# Patient Record
Sex: Female | Born: 1995 | Race: White | Hispanic: No | Marital: Single | Smoking: Never smoker
Health system: Southern US, Community
[De-identification: ages and names within clinical notes are randomized; demographics above are authoritative.]

## PROBLEM LIST (undated history)

## (undated) DIAGNOSIS — F419 Anxiety disorder, unspecified: Secondary | ICD-10-CM

## (undated) DIAGNOSIS — N83209 Unspecified ovarian cyst, unspecified side: Secondary | ICD-10-CM

## (undated) DIAGNOSIS — F909 Attention-deficit hyperactivity disorder, unspecified type: Secondary | ICD-10-CM

## (undated) DIAGNOSIS — Z8619 Personal history of other infectious and parasitic diseases: Secondary | ICD-10-CM

## (undated) DIAGNOSIS — B009 Herpesviral infection, unspecified: Secondary | ICD-10-CM

## (undated) DIAGNOSIS — G43909 Migraine, unspecified, not intractable, without status migrainosus: Secondary | ICD-10-CM

## (undated) DIAGNOSIS — N946 Dysmenorrhea, unspecified: Secondary | ICD-10-CM

## (undated) DIAGNOSIS — F32A Depression, unspecified: Secondary | ICD-10-CM

## (undated) DIAGNOSIS — J45909 Unspecified asthma, uncomplicated: Secondary | ICD-10-CM

## (undated) DIAGNOSIS — F329 Major depressive disorder, single episode, unspecified: Secondary | ICD-10-CM

## (undated) DIAGNOSIS — N809 Endometriosis, unspecified: Secondary | ICD-10-CM

## (undated) DIAGNOSIS — N83201 Unspecified ovarian cyst, right side: Secondary | ICD-10-CM

## (undated) HISTORY — DX: Endometriosis, unspecified: N80.9

## (undated) HISTORY — PX: APPENDECTOMY: SHX54

## (undated) HISTORY — DX: Migraine, unspecified, not intractable, without status migrainosus: G43.909

## (undated) HISTORY — DX: Dysmenorrhea, unspecified: N94.6

## (undated) HISTORY — DX: Unspecified ovarian cyst, right side: N83.201

## (undated) HISTORY — DX: Attention-deficit hyperactivity disorder, unspecified type: F90.9

## (undated) HISTORY — DX: Personal history of other infectious and parasitic diseases: Z86.19

## (undated) HISTORY — DX: Herpesviral infection, unspecified: B00.9

---
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
## 2014-04-21 DIAGNOSIS — N83201 Unspecified ovarian cyst, right side: Secondary | ICD-10-CM | POA: Insufficient documentation

## 2014-09-26 DIAGNOSIS — F900 Attention-deficit hyperactivity disorder, predominantly inattentive type: Secondary | ICD-10-CM | POA: Insufficient documentation

## 2015-09-01 ENCOUNTER — Ambulatory Visit
Admission: EM | Admit: 2015-09-01 | Discharge: 2015-09-01 | Disposition: A | Discharge status: Home / Self Care | Payer: 59 | Attending: Internal Medicine | Admitting: Internal Medicine

## 2015-09-01 ENCOUNTER — Ambulatory Visit (INDEPENDENT_AMBULATORY_CARE_PROVIDER_SITE_OTHER): Payer: 59

## 2015-09-01 DIAGNOSIS — M6740 Ganglion, unspecified site: Secondary | ICD-10-CM | POA: Diagnosis not present

## 2015-09-01 DIAGNOSIS — S63502A Unspecified sprain of left wrist, initial encounter: Secondary | ICD-10-CM

## 2015-09-01 HISTORY — DX: Anxiety disorder, unspecified: F41.9

## 2015-09-01 HISTORY — DX: Major depressive disorder, single episode, unspecified: F32.9

## 2015-09-01 HISTORY — DX: Unspecified ovarian cyst, unspecified side: N83.209

## 2015-09-01 HISTORY — DX: Depression, unspecified: F32.A

## 2015-09-01 MED ORDER — NAPROXEN 500 MG PO TABS: 500.0000 mg | ORAL_TABLET | Freq: Two times a day (BID) | ORAL | Status: DC

## 2015-09-01 NOTE — ED Notes (Signed)
In august initially injury doing 'burpees'.   Now feels worse.  Arrived with wrist in splint placed PTA.  Base of thumb pain is reported.  Also a "bump-like" protrusion reported to left wrist.

## 2015-09-01 NOTE — Discharge Instructions (Signed)
Ganglion Cyst  A ganglion cyst is a noncancerous, fluid-filled lump that occurs near joints or tendons. The ganglion cyst grows out of a joint or the lining of a tendon. It most often develops in the hand or wrist, but it can also develop in the shoulder, elbow, hip, knee, ankle, or foot. The round or oval ganglion cyst can be the size of a pea or larger than a grape. Increased activity may enlarge the size of the cyst because more fluid starts to build up.   CAUSES  It is not known what causes a ganglion cyst to grow. However, it may be related to:  · Inflammation or irritation around the joint.  · An injury.  · Repetitive movements or overuse.  · Arthritis.  RISK FACTORS  Risk factors include:  · Being a woman.  · Being age 20-50.  SIGNS AND SYMPTOMS  Symptoms may include:   · A lump. This most often appears on the hand or wrist, but it can occur in other areas of the body.  · Tingling.  · Pain.  · Numbness.  · Muscle weakness.  · Weak grip.  · Less movement in a joint.  DIAGNOSIS  Ganglion cysts are most often diagnosed based on a physical exam. Your health care provider will feel the lump and may shine a light alongside it. If it is a ganglion cyst, a light often shines through it. Your health care provider may order an X-ray, ultrasound, or MRI to rule out other conditions.  TREATMENT  Ganglion cysts usually go away on their own without treatment. If pain or other symptoms are involved, treatment may be needed. Treatment is also needed if the ganglion cyst limits your movement or if it gets infected. Treatment may include:  · Wearing a brace or splint on your wrist or finger.  · Taking anti-inflammatory medicine.  · Draining fluid from the lump with a needle (aspiration).  · Injecting a steroid into the joint.  · Surgery to remove the ganglion cyst.  HOME CARE INSTRUCTIONS  · Do not press on the ganglion cyst, poke it with a needle, or hit it.  · Take medicines only as directed by your health care  provider.  · Wear your brace or splint as directed by your health care provider.  · Watch your ganglion cyst for any changes.  · Keep all follow-up visits as directed by your health care provider. This is important.  SEEK MEDICAL CARE IF:  · Your ganglion cyst becomes larger or more painful.  · You have increased redness, red streaks, or swelling.  · You have pus coming from the lump.  · You have weakness or numbness in the affected area.  · You have a fever or chills.     This information is not intended to replace advice given to you by your health care provider. Make sure you discuss any questions you have with your health care provider.     Document Released: 10/11/2000 Document Revised: 11/04/2014 Document Reviewed: 03/29/2014  Elsevier Interactive Patient Education ©2016 [employer]

## 2015-09-01 NOTE — ED Notes (Addendum)
Can move wrist, although it does cause pain. PMS intact.   Brisk CAP refill.

## 2015-09-01 NOTE — ED Provider Notes (Signed)
CSN: [id number]     Arrival date & time 09/01/15  1701 History   First MD Initiated Contact with Patient 09/01/15 1836     Chief Complaint  Patient presents with  . Wrist Injury   (Consider location/radiation/quality/duration/timing/severity/associated sxs/prior Treatment) HPI   This a 19 year old female who presents with left radial wrist pain. She initially injured her wrist while doing kickboxing and fell on the floor while performing burpees. She states that it bothered her for a few weeks but seemed to improve. 2 days ago however she noticed a return of the pain and small bump at the base of her thumb which is very painful. 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 She's been using a Neopreme brace which at times does help. She also complains of some numbness in her thumb. She states that with flexion of her fingers it seems to make the pains worse as well.  Past Medical History  Diagnosis Date  . Depression   . Anxiety   . Ovarian cyst    Past Surgical History  Procedure Laterality Date  . Appendectomy     History reviewed. No pertinent family history. Social History  Substance Use Topics  . Smoking status: Never Smoker   . Smokeless tobacco: None  . Alcohol Use: 0.6 oz/week    1 Glasses of wine per week   OB History    No data available     Review of Systems  Constitutional: Positive for activity change. Negative for fever, chills and fatigue.  Musculoskeletal: Positive for myalgias.  Skin: Negative for color change, pallor, rash and wound.  All other systems reviewed and are negative.   Allergies  Wellbutrin  Home Medications   Prior to Admission medications   Medication Sig Start Date End Date Taking? Authorizing Provider  amphetamine-dextroamphetamine (ADDERALL) 20 MG tablet Take 20 mg by mouth as needed.   Yes Historical Provider, MD  busPIRone (BUSPAR) 7.5 MG tablet Take 7.5  mg by mouth as needed.   Yes Historical Provider, MD  naproxen (NAPROSYN) 500 MG tablet Take 1 tablet (500 mg total) by mouth 2 (two) times daily with a meal. 09/01/15   Lutricia FeilWilliam P Rebekah Zackery, PA-C   Meds Ordered and Administered this Visit  Medications - No data to display  BP 111/73 mmHg  Pulse 98  Temp(Src) 98.9 F (37.2 C) (Oral)  Resp 16  SpO2 100%  LMP  (LMP Unknown) No data found.   Physical Exam  Constitutional: She is oriented to person, place, and time. She appears well-developed and well-nourished. No distress.  HENT:  Head: Normocephalic and atraumatic.  Eyes: Pupils are equal, round, and reactive to light.  Neck: Neck supple.  Musculoskeletal: She exhibits tenderness. She exhibits no edema.  Examination of her left nondominant hand shows small firm nodule at the base of the thumb. There is no pulse appreciated with it. Examination is very limited due to patient cooperation complaining of severe pain out of proportion to findings. She complains of pain in all palpation of the thumb phalanges. There is no swelling ecchymosis or erythema present. Capillary refill is normal. Flexion of all fingers cause her to have discomfort.  Neurological: She is alert and oriented to person, place, and time.  Skin: Skin is warm and dry. She is not diaphoretic.  Psychiatric: She has a normal mood and affect. Her behavior is normal. Judgment and thought content normal.  Nursing  note and vitals reviewed.   ED Course  Procedures (including critical care time)  Labs Review Labs Reviewed - No data to display  Imaging Review Dg Wrist Complete Left  09/01/2015  CLINICAL DATA:  19 year old female with injury to the left wrist while kickboxing in August complaining of wrist pain. EXAM: LEFT WRIST - COMPLETE 3+ VIEW COMPARISON:  No priors. FINDINGS: There is no evidence of fracture or dislocation. There is no evidence of arthropathy or other focal bone abnormality. Soft tissues are unremarkable.  IMPRESSION: Negative. Electronically Signed   By: Trudie Reed M.D.   On: 09/01/2015 19:15     Visual Acuity Review  Right Eye Distance:   Left Eye Distance:   Bilateral Distance:    Right Eye Near:   Left Eye Near:    Bilateral Near:     19:30:39 Orders Placed WR  Thumb spica         MDM   1. Left wrist sprain, initial encounter   2. Volar retinacular ganglion    Discharge Medication List as of 09/01/2015  7:36 PM    START taking these medications   Details  naproxen (NAPROSYN) 500 MG tablet Take 1 tablet (500 mg total) by mouth 2 (two) times daily with a meal., Starting 09/01/2015, Until Discontinued, Print      Plan: 1. Test/x-ray results and diagnosis reviewed with patient 2. rx as per orders; risks, benefits, potential side effects reviewed with patient 3. Recommend supportive treatment with elevation /ice rest. Use splint at HS and work. Come out for quiet times.  4. F/u prn if symptoms worsen or don't improve. Consider hand surgeon for volar ganglion.                      Lutricia Feil, PA-C 09/01/15 [phone]

## 2015-11-05 ENCOUNTER — Emergency Department (HOSPITAL_COMMUNITY)
Admission: EM | Admit: 2015-11-05 | Discharge: 2015-11-05 | Disposition: A | Discharge status: Home / Self Care | Payer: 59 | Attending: Emergency Medicine | Admitting: Emergency Medicine

## 2015-11-05 ENCOUNTER — Encounter (HOSPITAL_COMMUNITY): Payer: Self-pay | Admitting: Emergency Medicine

## 2015-11-05 ENCOUNTER — Emergency Department (HOSPITAL_COMMUNITY): Payer: 59

## 2015-11-05 DIAGNOSIS — Z791 Long term (current) use of non-steroidal anti-inflammatories (NSAID): Secondary | ICD-10-CM | POA: Diagnosis not present

## 2015-11-05 DIAGNOSIS — M549 Dorsalgia, unspecified: Secondary | ICD-10-CM | POA: Diagnosis not present

## 2015-11-05 DIAGNOSIS — W000XXA Fall on same level due to ice and snow, initial encounter: Secondary | ICD-10-CM | POA: Insufficient documentation

## 2015-11-05 DIAGNOSIS — Y9289 Other specified places as the place of occurrence of the external cause: Secondary | ICD-10-CM | POA: Diagnosis not present

## 2015-11-05 DIAGNOSIS — M533 Sacrococcygeal disorders, not elsewhere classified: Secondary | ICD-10-CM | POA: Diagnosis not present

## 2015-11-05 DIAGNOSIS — Y998 Other external cause status: Secondary | ICD-10-CM | POA: Diagnosis not present

## 2015-11-05 DIAGNOSIS — S300XXA Contusion of lower back and pelvis, initial encounter: Secondary | ICD-10-CM | POA: Diagnosis not present

## 2015-11-05 DIAGNOSIS — Y9389 Activity, other specified: Secondary | ICD-10-CM | POA: Insufficient documentation

## 2015-11-05 DIAGNOSIS — Z8742 Personal history of other diseases of the female genital tract: Secondary | ICD-10-CM | POA: Insufficient documentation

## 2015-11-05 DIAGNOSIS — M545 Low back pain: Secondary | ICD-10-CM | POA: Diagnosis not present

## 2015-11-05 DIAGNOSIS — F419 Anxiety disorder, unspecified: Secondary | ICD-10-CM | POA: Diagnosis not present

## 2015-11-05 DIAGNOSIS — J45909 Unspecified asthma, uncomplicated: Secondary | ICD-10-CM | POA: Diagnosis not present

## 2015-11-05 DIAGNOSIS — S3992XA Unspecified injury of lower back, initial encounter: Secondary | ICD-10-CM | POA: Diagnosis present

## 2015-11-05 DIAGNOSIS — W19XXXA Unspecified fall, initial encounter: Secondary | ICD-10-CM

## 2015-11-05 HISTORY — DX: Unspecified asthma, uncomplicated: J45.909

## 2015-11-05 LAB — POC URINE PREG, ED: Preg Test, Ur: NEGATIVE

## 2015-11-05 MED ORDER — ONDANSETRON 4 MG PO TBDP
4.0000 mg | ORAL_TABLET | Freq: Once | ORAL | Status: AC
  Administered 2015-11-05: 4 mg via ORAL
  Filled 2015-11-05: qty 1

## 2015-11-05 MED ORDER — OXYCODONE-ACETAMINOPHEN 5-325 MG PO TABS
1.0000 | ORAL_TABLET | Freq: Once | ORAL | Status: AC
  Administered 2015-11-05: 1 via ORAL
  Filled 2015-11-05: qty 1

## 2015-11-05 MED ORDER — NAPROXEN 500 MG PO TABS: 500.0000 mg | ORAL_TABLET | Freq: Two times a day (BID) | ORAL | Status: DC

## 2015-11-05 NOTE — ED Notes (Signed)
Warm blankets given.

## 2015-11-05 NOTE — ED Notes (Signed)
Patient transported to X-ray 

## 2015-11-05 NOTE — Discharge Instructions (Signed)
Your x-rays today were normal-- no fracture seen. Take the prescribed medication as directed. You may continue to be sore for the next few days which is normal. Return to the ED for new or worsening symptoms.

## 2015-11-05 NOTE — ED Notes (Signed)
Pt. Felt better after eating crackers and gingerale

## 2015-11-05 NOTE — ED Provider Notes (Signed)
CSN: [id number]     Arrival date & time 11/05/15  1138 History   First MD Initiated Contact with Patient 11/05/15 1143     Chief Complaint  Patient presents with  . Fall     (Consider location/radiation/quality/duration/timing/severity/associated sxs/prior Treatment) Patient is a 20 y.o. female presenting with fall. The history is provided by the patient and medical records.  Fall Associated symptoms include arthralgias.     20 y.o. F with hx of depression, anxiety, asthma, ovarian cysts, presenting to the ED following a fall on ice this morning.  Patient states she was walking out to her car to go to work today when she slipped on some black ice and fell back onto her butt and low back.  No head injury or LOC.  Patient states she has severe pain at her "tailbone".  Denies numbness/weakness of her lower extremities.  No bowel or bladder incontinence.  Patient states she is ambulatory, but states it is painful. She has not had any medications prior to arrival.  Patient states she is concerned because she suffered T9-T11 fractures as a child from a fall from height.  States no significant issues with her back since this time.  Past Medical History  Diagnosis Date  . Depression   . Anxiety   . Ovarian cyst   . Asthma    Past Surgical History  Procedure Laterality Date  . Appendectomy     History reviewed. No pertinent family history. Social History  Substance Use Topics  . Smoking status: Never Smoker   . Smokeless tobacco: None  . Alcohol Use: 0.6 oz/week    1 Glasses of wine per week   OB History    No data available     Review of Systems  Musculoskeletal: Positive for back pain and arthralgias.  All other systems reviewed and are negative.     Allergies  Wellbutrin  Home Medications   Prior to Admission medications   Medication Sig Start Date End Date Taking? Authorizing Provider  amphetamine-dextroamphetamine (ADDERALL) 20 MG tablet Take 20 mg by mouth as needed.     Historical Provider, MD  busPIRone (BUSPAR) 7.5 MG tablet Take 7.5 mg by mouth as needed.    Historical Provider, MD  naproxen (NAPROSYN) 500 MG tablet Take 1 tablet (500 mg total) by mouth 2 (two) times daily with a meal. 09/01/15   Lutricia Feil, PA-C   BP 106/59 mmHg  Pulse 98  Temp(Src) 98.4 F (36.9 C) (Oral)  Resp 18  Ht 5' (1.524 m)  Wt 43.092 kg  BMI 18.55 kg/m2  SpO2 100%  LMP 10/05/2015   Physical Exam  Constitutional: She is oriented to person, place, and time. She appears well-developed and well-nourished. No distress.  HENT:  Head: Normocephalic and atraumatic.  Mouth/Throat: Oropharynx is clear and moist.  Eyes: Conjunctivae and EOM are normal. Pupils are equal, round, and reactive to light.  Neck: Normal range of motion. Neck supple.  Cardiovascular: Normal rate, regular rhythm and normal heart sounds.   Pulmonary/Chest: Effort normal and breath sounds normal. No respiratory distress. She has no wheezes.  Musculoskeletal: Normal range of motion.       Lumbar back: She exhibits tenderness, bony tenderness and pain.       Back:  TTP of lower lumbar spine and coccyx; no deformities noted; normal strength and sensation of BLE; gait slow but steady  Neurological: She is alert and oriented to person, place, and time.  Skin: Skin is warm and  dry. She is not diaphoretic.  Psychiatric: She has a normal mood and affect.  Nursing note and vitals reviewed.   ED Course  Procedures (including critical care time) Labs Review Labs Reviewed  POC URINE PREG, ED    Imaging Review Dg Lumbar Spine Complete  11/05/2015  CLINICAL DATA:  Coccyx and back pain status post fall today. EXAM: LUMBAR SPINE - COMPLETE 4+ VIEW COMPARISON:  None. FINDINGS: There is no evidence of lumbar spine fracture. Alignment is normal. Intervertebral disc spaces are maintained. IMPRESSION: Negative. Electronically Signed   By: Ted Mcalpineobrinka  Dimitrova M.D.   On: 11/05/2015 13:47   Dg  Sacrum/coccyx  11/05/2015  CLINICAL DATA:  Status post slip and fall today on ice with the buttock injury. Coccygeal pain. Initial encounter. EXAM: SACRUM AND COCCYX - 2+ VIEW COMPARISON:  None. FINDINGS: There is no evidence of fracture or other focal bone lesions. IMPRESSION: Negative exam. Electronically Signed   By: Drusilla Kannerhomas  Dalessio M.D.   On: 11/05/2015 13:47   I have personally reviewed and evaluated these images and lab results as part of my medical decision-making.   EKG Interpretation None      MDM   Final diagnoses:  Fall, initial encounter  Coccyx contusion, initial encounter   20 year old female here with back and buttock pain after a fall this morning. She landed on her coccyx. No head injury or loss of consciousness. On exam she has tenderness of her lower lumbar spine and coccyx. There is no acute bony deformity noted on exam. X-rays were obtained which are negative for acute findings. Patient remains neurologically intact. She's been ambulatory with slow but steady gait.  Will d/c home  Discussed plan with patient, he/she acknowledged understanding and agreed with plan of care.  Return precautions given for new or worsening symptoms.  At time of discharge once patient began moving she had episode of emesis.  She was given oral zofran.  Denies current abdominal pain.  VS remain stable.  Suspect this may have been from the pain medications. Patient was able to eat crackers and drink gingerale prior to discharge.    Garlon HatchetLisa M Chaitanya Amedee, PA-C 11/05/15 1515  Geoffery Lyonsouglas Delo, MD 11/05/15 [phone]

## 2015-11-05 NOTE — ED Notes (Signed)
Pt. Went to get up oob and developed nausea and vomited.  WE gave her a emesis bag and put her back into the bed.  Pt. Also given Zofran ODT. She is eating crackers and gingerale at the present time

## 2015-11-05 NOTE — ED Notes (Signed)
Was scraping car to go to work when she fell landing on her coccyx. Prone position on stretcher is position of comfort. Denies n/t distally.  Is concerned because she had t9, 10, 11 fx as younger child.

## 2015-11-15 DIAGNOSIS — H5213 Myopia, bilateral: Secondary | ICD-10-CM | POA: Diagnosis not present

## 2015-12-03 ENCOUNTER — Ambulatory Visit (INDEPENDENT_AMBULATORY_CARE_PROVIDER_SITE_OTHER): Payer: 59 | Admitting: Family Medicine

## 2015-12-03 VITALS — BP 108/84 | HR 87 | Temp 98.6°F | Resp 16 | Ht 63.0 in | Wt 102.0 lb

## 2015-12-03 DIAGNOSIS — N3001 Acute cystitis with hematuria: Secondary | ICD-10-CM | POA: Diagnosis not present

## 2015-12-03 DIAGNOSIS — R300 Dysuria: Secondary | ICD-10-CM | POA: Diagnosis not present

## 2015-12-03 DIAGNOSIS — N3000 Acute cystitis without hematuria: Secondary | ICD-10-CM | POA: Diagnosis not present

## 2015-12-03 LAB — POCT URINALYSIS DIP (MANUAL ENTRY)
BILIRUBIN UA: NEGATIVE
Bilirubin, UA: NEGATIVE
Glucose, UA: 100 — AB
Nitrite, UA: POSITIVE — AB
PROTEIN UA: NEGATIVE
UROBILINOGEN UA: 0.2
pH, UA: 5.5

## 2015-12-03 LAB — POC MICROSCOPIC URINALYSIS (UMFC): Mucus: ABSENT

## 2015-12-03 MED ORDER — NITROFURANTOIN MONOHYD MACRO 100 MG PO CAPS: 100.0000 mg | ORAL_CAPSULE | Freq: Two times a day (BID) | ORAL | Status: DC

## 2015-12-03 NOTE — Patient Instructions (Signed)

## 2015-12-03 NOTE — Progress Notes (Signed)
Subjective:    Patient ID: Hayley Jensen, female    DOB: 1996-02-01, 20 y.o.   MRN: [id number] By signing my name below, I, Javier Docker, attest that this documentation has been prepared under the direction and in the presence of Norberto Sorenson, MD. Electronically Signed: Javier Docker, ER Scribe. 12/03/2015. 11:23 AM.  Chief Complaint  Patient presents with  . Dysuria    x 3 days, hx of UTIs    HPI HPI Comments: Hayley Jensen is a 20 y.o. female with a past hx of UTIs who presents to [facility] complaining of dysuria and urinary urgency for the last three days. She also noted kidney pain and back spasms one week ago. She denies current flank pain. She denies hematuria or vaginal discharge. She was last treated for a UTI in June, 2016. She is taking OTC AZO. She is taking kaitlin birth control pills. Her bowel movements have been normal.   Past Medical History  Diagnosis Date  . Depression   . Anxiety   . Ovarian cyst   . Asthma    Allergies  Allergen Reactions  . Wellbutrin [Bupropion] Nausea And Vomiting   Current Outpatient Prescriptions on File Prior to Visit  Medication Sig Dispense Refill  . albuterol (PROVENTIL HFA;VENTOLIN HFA) 108 (90 Base) MCG/ACT inhaler Inhale 1 puff into the lungs every 6 (six) hours as needed for wheezing or shortness of breath.    . amphetamine-dextroamphetamine (ADDERALL) 20 MG tablet Take 20 mg by mouth daily as needed.     . busPIRone (BUSPAR) 7.5 MG tablet Take 7.5 mg by mouth daily as needed. For anxiety    . naproxen (NAPROSYN) 500 MG tablet Take 1 tablet (500 mg total) by mouth 2 (two) times daily with a meal. 30 tablet 0  . Norethindrone-Ethinyl Estradiol-Fe (KAITLIB FE) 0.8-25 MG-MCG tablet Chew 1 tablet by mouth daily.     No current facility-administered medications on file prior to visit.    Review of Systems  Constitutional: Negative for fever and chills.  Gastrointestinal: Negative for nausea, abdominal pain, diarrhea and  constipation.  Genitourinary: Positive for dysuria, urgency and frequency.      Objective:  BP 108/84 mmHg  Pulse 87  Temp(Src) 98.6 F (37 C)  Resp 16  Ht  (1.6 m)  Wt 102 lb (46.267 kg)  BMI 18.07 kg/m2  SpO2 98%  LMP 10/05/2015  Physical Exam  Constitutional: She is oriented to person, place, and time. She appears well-developed and well-nourished. No distress.  HENT:  Head: Normocephalic and atraumatic.  Mouth/Throat: Oropharynx is clear and moist. No oropharyngeal exudate.  Eyes: Pupils are equal, round, and reactive to light.  Neck: Neck supple.  Cardiovascular: Normal rate, regular rhythm and normal heart sounds.   No murmur heard. Pulmonary/Chest: Effort normal. No respiratory distress.  Abdominal: Soft. She exhibits no distension. There is no tenderness.  No significant CVA tenderness.  Musculoskeletal: Normal range of motion.  Neurological: She is alert and oriented to person, place, and time. Coordination normal.  Skin: Skin is warm and dry. She is not diaphoretic.  Psychiatric: She has a normal mood and affect. Her behavior is normal.  Nursing note and vitals reviewed.   Results for orders placed or performed in visit on 12/03/15  POCT Microscopic Urinalysis ([facility])  Result Value Ref Range   WBC,UR,HPF,POC Moderate (A) None WBC/hpf   RBC,UR,HPF,POC Few (A) None RBC/hpf   Bacteria Few (A) None, Too numerous to count   Mucus  Absent Absent   Epithelial Cells, UR Per Microscopy Few (A) None, Too numerous to count cells/hpf  POCT urinalysis dipstick  Result Value Ref Range   Color, UA orange (A) yellow   Clarity, UA clear clear   Glucose, UA =100 (A) negative   Bilirubin, UA negative negative   Ketones, POC UA negative negative   Spec Grav, UA <=1.005    Blood, UA small (A) negative   pH, UA 5.5    Protein Ur, POC negative negative   Urobilinogen, UA 0.2    Nitrite, UA Positive (A) Negative   Leukocytes, UA small (1+) (A) Negative      Assessment &  Plan:   1. Dysuria   2. Acute cystitis with hematuria     Orders Placed This Encounter  Procedures  . Urine culture  . POCT Microscopic Urinalysis ([facility])  . POCT urinalysis dipstick    Meds ordered this encounter  Medications  . nitrofurantoin, macrocrystal-monohydrate, (MACROBID) 100 MG capsule    Sig: Take 1 capsule (100 mg total) by mouth 2 (two) times daily.    Dispense:  14 capsule    Refill:  0    I personally performed the services described in this documentation, which was scribed in my presence. The recorded information has been reviewed and considered, and addended by me as needed.  Norberto Sorenson, MD MPH

## 2015-12-05 LAB — URINE CULTURE

## 2015-12-22 ENCOUNTER — Encounter: Payer: Self-pay | Admitting: Family Medicine

## 2016-01-06 DIAGNOSIS — N898 Other specified noninflammatory disorders of vagina: Secondary | ICD-10-CM | POA: Diagnosis not present

## 2016-01-06 DIAGNOSIS — N760 Acute vaginitis: Secondary | ICD-10-CM | POA: Diagnosis not present

## 2016-02-29 ENCOUNTER — Ambulatory Visit (INDEPENDENT_AMBULATORY_CARE_PROVIDER_SITE_OTHER): Payer: 59 | Admitting: Physician Assistant

## 2016-02-29 VITALS — BP 112/70 | HR 99 | Temp 99.0°F | Resp 16 | Ht 63.0 in | Wt 103.0 lb

## 2016-02-29 DIAGNOSIS — R238 Other skin changes: Secondary | ICD-10-CM

## 2016-02-29 DIAGNOSIS — R21 Rash and other nonspecific skin eruption: Secondary | ICD-10-CM

## 2016-02-29 MED ORDER — VALACYCLOVIR HCL 500 MG PO TABS: 500.0000 mg | ORAL_TABLET | Freq: Two times a day (BID) | ORAL | Status: DC

## 2016-02-29 NOTE — Patient Instructions (Signed)
     IF you received an x-ray today, you will receive an invoice from [facility]. Please contact [facility] at [phone] with questions or concerns regarding your invoice.   IF you received labwork today, you will receive an invoice from [facility]. Please contact [facility] at [phone] with questions or concerns regarding your invoice.   Our billing staff will not be able to assist you with questions regarding bills from these companies.  You will be contacted with the lab results as soon as they are available. The fastest way to get your results is to activate your My Chart account. Instructions are located on the last page of this paperwork. If you have not heard from us regarding the results in 2 weeks, please contact this office.      

## 2016-02-29 NOTE — Progress Notes (Signed)
02/29/2016 2:57 PM   DOB: Apr 07, 1996 / MRN: [id number]  SUBJECTIVE:  Hayley Jensen is a 20 y.o. female presenting for a vesicular rash just lateral to the left labia minora.  Reports she was at the beach last week and go too much sun and this problem started 3 days later.  She reports a positive HSV1 culture of the same area about four months ago at [facility].  She reports the rash is mildly painful today, and the pain is much less than the initial outbreak four months ago.  She would like to have some medication on hand for future outbreaks.  She reports no new partners at this time.   She is allergic to wellbutrin.   She  has a past medical history of Depression; Anxiety; Ovarian cyst; and Asthma.    She  reports that she has never smoked. She does not have any smokeless tobacco history on file. She reports that she drinks about 0.6 oz of alcohol per week. She reports that she does not use illicit drugs. She  reports that she currently engages in sexual activity. She reports using the following method of birth control/protection: Pill. The patient  has past surgical history that includes Appendectomy.  Her family history is not on file.  Review of Systems  Constitutional: Negative for fever and chills.  Eyes: Negative for blurred vision.  Respiratory: Negative for cough and shortness of breath.   Cardiovascular: Negative for chest pain.  Gastrointestinal: Negative for nausea and abdominal pain.  Genitourinary: Negative for dysuria, urgency and frequency.  Musculoskeletal: Negative for myalgias.  Skin: Negative for rash.  Neurological: Positive for tingling. Negative for dizziness and headaches.  Psychiatric/Behavioral: Negative for depression. The patient is not nervous/anxious.     Problem list and medications reviewed and updated by myself where necessary, and exist elsewhere in the encounter.   OBJECTIVE:  BP 112/70 mmHg  Pulse 99  Temp(Src) 99 F (37.2 C)  Resp 16  Ht  5\' 3"  (1.6 m)  Wt 103 lb (46.72 kg)  BMI 18.25 kg/m2  SpO2 98%  Physical Exam  Constitutional: She is oriented to person, place, and time. She appears well-developed.  Cardiovascular: Normal rate and regular rhythm.   Pulmonary/Chest: Effort normal and breath sounds normal.  Genitourinary:    No tenderness or bleeding in the vagina. No vaginal discharge found.  Neurological: She is alert and oriented to person, place, and time.  Skin: She is not diaphoretic.    No results found for this or any previous visit (from the past 72 hour(s)).  No results found.  ASSESSMENT AND PLAN  Hayley LedererKylyn was seen today for other.  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  Will provide her with treatment today, and enough medication to treat future outbreaks should they occur.  RTC if her symptoms change or the rash does not improve with therapy.  -     valACYclovir (VALTREX) 500 MG tablet; Take 1 tablet (500 mg total) by mouth 2 (two) times daily. Take for three days.  Repeat course at first sign of a new outbreak.    The patient was advised to call or return to clinic if she does not see an improvement in symptoms or to seek the care of the closest emergency department if she worsens with the above plan.   Deliah BostonMichael Clark, MHS, PA-C [facility]  02/29/2016 2:57 PM

## 2016-05-18 ENCOUNTER — Ambulatory Visit (INDEPENDENT_AMBULATORY_CARE_PROVIDER_SITE_OTHER): Payer: 59 | Admitting: Family Medicine

## 2016-05-18 VITALS — BP 124/80 | HR 63 | Temp 98.3°F | Resp 16 | Ht 63.0 in | Wt 99.6 lb

## 2016-05-18 DIAGNOSIS — R21 Rash and other nonspecific skin eruption: Secondary | ICD-10-CM

## 2016-05-18 DIAGNOSIS — L42 Pityriasis rosea: Secondary | ICD-10-CM

## 2016-05-18 DIAGNOSIS — N946 Dysmenorrhea, unspecified: Secondary | ICD-10-CM | POA: Insufficient documentation

## 2016-05-18 LAB — POCT SKIN KOH: SKIN KOH, POC: NEGATIVE

## 2016-05-18 MED ORDER — TRIAMCINOLONE ACETONIDE 0.1 % EX CREA: 1.0000 "application " | TOPICAL_CREAM | Freq: Two times a day (BID) | CUTANEOUS | Status: DC

## 2016-05-18 NOTE — Progress Notes (Signed)
By signing my name below, I, Hayley Jensen, attest that this documentation has been prepared under the direction and in the presence of Hayley Staggers, MD.  Electronically Signed: Arvilla Jensen, Medical Scribe. 05/18/2016. 11:10 AM.  Subjective:    Patient ID: Hayley Jensen, female    DOB: 06/26/1996, 20 y.o.   MRN: [id number]  HPI Chief Complaint  Patient presents with  . Rash    1 1/2 week - itches and spreading - has a patch of rash on Rt leg x 1 month    HPI Comments: Hayley Jensen is a 20 y.o. female who presents to the [facility] complaining of rash that started on the back of her thigh onset a month. Pt mentions her rash has now spread to her abdomen, and back. Pt reports itchiness from the rash. Pt states if it gets brushes up on then it becomes itchy. Pt has dry mouth when she wakes up and states it's even dryer today. Pt states this is irregular for her, even in the summer. Pt has tried steroid, and anti-fungal cream BID for a day and a half- this caused her rash to spread. Pt states it's not contagious because her boyfriend hasn't got it. Pt states she went through a high stress experience about a month ago and the rash appeared shortly after. Pt states she has had shingles. Pt states she's currently on her menses. Pt has endosalpingiosis, and takes hydrocodone PRN for relief.  Pt denies having a cold before the rash occurred. Pt denies using any new creams, lotions, or body sprays. Pt denies fevers, chill, or weight loss.  Pt takes Adderall occasionally PRN. FHx: of psoriasis   Patient Active Problem List   Diagnosis Date Noted  . Dysmenorrhea 05/18/2016  . 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 Cyst of right ovary 04/21/2014   Past Medical History  Diagnosis Date  . Depression   . Anxiety   . Ovarian cyst   . Asthma    Past Surgical History  Procedure Laterality Date  . Appendectomy     Allergies    Allergen Reactions  . Wellbutrin [Bupropion] Nausea And Vomiting   Prior to Admission medications   Medication Sig Start Date End Date Taking? Authorizing Provider  albuterol (PROVENTIL HFA;VENTOLIN HFA) 108 (90 Base) MCG/ACT inhaler Inhale 1 puff into the lungs every 6 (six) hours as needed for wheezing or shortness of breath.   Yes Historical Provider, MD  amphetamine-dextroamphetamine (ADDERALL) 20 MG tablet Take 20 mg by mouth daily as needed.    Yes Historical Provider, MD  busPIRone (BUSPAR) 7.5 MG tablet Take 7.5 mg by mouth daily as needed. For anxiety   Yes Historical Provider, MD  naproxen (NAPROSYN) 500 MG tablet Take 1 tablet (500 mg total) by mouth 2 (two) times daily with a meal. 11/05/15  Yes Garlon Hatchet, PA-C  Norethindrone-Ethinyl Estradiol-Fe (KAITLIB FE) 0.8-25 MG-MCG tablet Chew 1 tablet by mouth daily.   Yes Historical Provider, MD  valACYclovir (VALTREX) 500 MG tablet Take 1 tablet (500 mg total) by mouth 2 (two) times daily. Take for three days.  Repeat course at first sign of a new outbreak. 02/29/16  Yes Ofilia Neas, PA-C   Social History   Social History  . Marital Status: Single    Spouse Name: Hayley Jensen  . Number of Children: Hayley Jensen  . Years of Education: Hayley Jensen   Occupational History  . Not on file.   Social  History Main Topics  . Smoking status: Never Smoker   . Smokeless tobacco: Not on file  . Alcohol Use: 0.6 oz/week    1 Glasses of wine per week  . Drug Use: No  . Sexual Activity: Yes    Birth Control/ Protection: Pill   Other Topics Concern  . Not on file   Social History Narrative   Review of Systems  Constitutional: Negative for fever, chills and unexpected weight change.  Skin: Positive for rash.    Objective:  BP 124/80 mmHg  Pulse 63  Temp(Src) 98.3 F (36.8 C) (Oral)  Resp 16  Ht 5\' 3"  (1.6 m)  Wt 99 lb 9.6 oz (45.178 kg)  BMI 17.65 kg/m2  SpO2 100%  LMP 05/17/2016  Physical Exam  Constitutional: She appears well-developed and  well-nourished. No distress.  HENT:  Head: Normocephalic and atraumatic.  Eyes: Conjunctivae are normal.  Neck: Neck supple.  Cardiovascular: Normal rate.   Pulmonary/Chest: Effort normal.  Neurological: She is alert.  Skin: Skin is warm and dry.  Patch on the right posterior thigh- measures 3cm across Slight dry appearance Faint erythema with a dry scale Possible central clearing, there are other scattered patches on her abdomen with slight erythema vs salmon color pigmentation with faint scale on larger areas Few scattered patches on the upper chest and a few scattered lesions on the back  Psychiatric: She has a normal mood and affect. Her behavior is normal.  Nursing note and vitals reviewed.  Results for orders placed or performed in visit on 05/18/16  POCT Skin KOH  Result Value Ref Range   Skin KOH, POC Negative   Scraping was performed on the thigh lesion.  Assessment & Plan:   Tavionna Grout Gawthrop is a 20 y.o. female Rash and nonspecific skin eruption - Plan: POCT Skin KOH, triamcinolone cream (KENALOG) 0.1 %  Pityriasis rosea  Suspected pityriasis rosea with herald lesion/patch on her right thigh. Does not appear to have rash in sun exposed areas, less likely tinea versicolor. Less likely drug reaction.  -Symptomatic care discussed, Kenalog topical if needed for itching areas, and over-the-counter antihistamine if needed. RTC precautions and handout given on after visit summary.  Meds ordered this encounter  Medications  . albuterol (PROVENTIL HFA;VENTOLIN HFA) 108 (90 Base) MCG/ACT inhaler    Sig: Inhale into the lungs.  Marland Kitchen HYDROcodone-acetaminophen (NORCO/VICODIN) 5-325 MG tablet    Sig: Take by mouth.  . nystatin cream (MYCOSTATIN)    Sig: Apply topically.  Marland Kitchen amphetamine-dextroamphetamine (ADDERALL) 20 MG tablet    Sig: Take by mouth.  . Norethindrone-Ethinyl Estradiol-Fe (KAITLIB FE) 0.8-25 MG-MCG tablet    Sig:   . naproxen (NAPROSYN) 500 MG tablet    Sig: Take by  mouth.  . triamcinolone cream (KENALOG) 0.1 %    Sig: Apply 1 application topically 2 (two) times daily.    Dispense:  30 g    Refill:  0   Patient Instructions     Your rash appears to be due to pure Isis rosea. See information on this condition below. Sometimes this can cause some itching, so okay to use the steroid cream I prescribed 2-3 times per day at the most. Continue over-the-counter allergy medicine such as Allegra, Claritin, or Zyrtec. If any spread of rash to genitals or mouth lesions, return right away as this would not be due to that condition. Let me know if you have questions in the meantime.    Pityriasis Rosea Pityriasis rosea is a  rash that usually appears on the trunk of the body. It may also appear on the upper arms and upper legs. It usually begins as a single patch, and then more patches begin to develop. The rash may cause mild itching, but it normally does not cause other problems. It usually goes away without treatment. However, it may take weeks or months for the rash to go away completely. CAUSES The cause of this condition is not known. The condition does not spread from person to person (is noncontagious). RISK FACTORS This condition is more likely to develop in young adults and children. It is most common in the spring and fall. SYMPTOMS The main symptom of this condition is a rash.  The rash usually begins with a single oval patch that is larger than the ones that follow. This is called a herald patch. It generally appears a week or more before the rest of the rash appears.  When more patches start to develop, they spread quickly on the trunk, back, and arms. These patches are smaller than the first one.  The patches that make up the rash are usually oval-shaped and pink or red in color. They are usually flat, but they may sometimes be raised so that they can be felt with a finger. They may also be finely crinkled and have a scaly ring around the edge.  The  rash does not typically appear on areas of the skin that are exposed to the sun. Most people who have this condition do not have other symptoms, but some have mild itching. In a few cases, a mild headache or body aches may occur before the rash appears and then go away. DIAGNOSIS Your health care provider may diagnose this condition by doing a physical exam and taking your medical history. To rule out other possible causes for the rash, the health care provider may order blood tests or take a skin sample from the rash to be looked at under a microscope. TREATMENT Usually, treatment is not needed for this condition. The rash will probably go away on its own in 4-8 weeks. In some cases, a health care provider may recommend or prescribe medicine to reduce itching. HOME CARE INSTRUCTIONS  Take medicines only as directed by your health care provider.  Avoid scratching the affected areas of skin.  Do not take hot baths or use a sauna. Use only warm water when bathing or showering. Heat can increase itching. SEEK MEDICAL CARE IF:  Your rash does not go away in 8 weeks.  Your rash gets much worse.  You have a fever.  You have swelling or pain in the rash area.  You have fluid, blood, or pus coming from the rash area.   This information is not intended to replace advice given to you by your health care provider. Make sure you discuss any questions you have with your health care provider.   Document Released: 11/20/2001 Document Revised: 02/28/2015 Document Reviewed: 09/21/2014 Elsevier Interactive Patient Education 2016 [employer].   IF you received an x-ray today, you will receive an invoice from [facility]. Please contact [facility] at [phone] with questions or concerns regarding your invoice.   IF you received labwork today, you will receive an invoice from [employer]. Please contact [facility] at [phone] with questions or concerns  regarding your invoice.   Our billing staff will not be able to assist you with questions regarding bills from these companies.  You will be contacted with  the lab results as soon as they are available. The fastest way to get your results is to activate your My Chart account. Instructions are located on the last page of this paperwork. If you have not heard from [place] regarding the results in 2 weeks, please contact this office.          I personally performed the services described in this documentation, which was scribed in my presence. The recorded information has been reviewed and considered, and addended by me as needed.   Signed,   Hayley Staggers, MD [facility].  05/18/2016 11:53 AM

## 2016-05-18 NOTE — Patient Instructions (Addendum)
Your rash appears to be due to Pityriasis Rosea. See information on this condition below. Sometimes this can cause some itching, so okay to use the steroid cream I prescribed (2-3 times per day at the most). Continue over-the-counter allergy medicine such as Allegra, Claritin, or Zyrtec. If any spread of rash to genitals or mouth lesions, return right away as this would not be due to that condition. Let me know if you have questions in the meantime.   Pityriasis Rosea Pityriasis rosea is a rash that usually appears on the trunk of the body. It may also appear on the upper arms and upper legs. It usually begins as a single patch, and then more patches begin to develop. The rash may cause mild itching, but it normally does not cause other problems. It usually goes away without treatment. However, it may take weeks or months for the rash to go away completely. CAUSES The cause of this condition is not known. The condition does not spread from person to person (is noncontagious). RISK FACTORS This condition is more likely to develop in young adults and children. It is most common in the spring and fall. SYMPTOMS The main symptom of this condition is a rash.  The rash usually begins with a single oval patch that is larger than the ones that follow. This is called a herald patch. It generally appears a week or more before the rest of the rash appears.  When more patches start to develop, they spread quickly on the trunk, back, and arms. These patches are smaller than the first one.  The patches that make up the rash are usually oval-shaped and pink or red in color. They are usually flat, but they may sometimes be raised so that they can be felt with a finger. They may also be finely crinkled and have a scaly ring around the edge.  The rash does not typically appear on areas of the skin that are exposed to the sun. Most people who have this condition do not have other symptoms, but some have mild  itching. In a few cases, a mild headache or body aches may occur before the rash appears and then go away. DIAGNOSIS Your health care provider may diagnose this condition by doing a physical exam and taking your medical history. To rule out other possible causes for the rash, the health care provider may order blood tests or take a skin sample from the rash to be looked at under a microscope. TREATMENT Usually, treatment is not needed for this condition. The rash will probably go away on its own in 4-8 weeks. In some cases, a health care provider may recommend or prescribe medicine to reduce itching. HOME CARE INSTRUCTIONS  Take medicines only as directed by your health care provider.  Avoid scratching the affected areas of skin.  Do not take hot baths or use a sauna. Use only warm water when bathing or showering. Heat can increase itching. SEEK MEDICAL CARE IF:  Your rash does not go away in 8 weeks.  Your rash gets much worse.  You have a fever.  You have swelling or pain in the rash area.  You have fluid, blood, or pus coming from the rash area.   This information is not intended to replace advice given to you by your health care provider. Make sure you discuss any questions you have with your health care provider.   Document Released: 11/20/2001 Document Revised: 02/28/2015 Document Reviewed: 09/21/2014 Elsevier Interactive Patient Education  2016 Elsevier Inc.   IF you received an x-ray today, you will receive an invoice from [facility]. Please contact [facility] at [phone] with questions or concerns regarding your invoice.   IF you received labwork today, you will receive an invoice from [employer]. Please contact [facility] at [phone] with questions or concerns regarding your invoice.   Our billing staff will not be able to assist you with questions regarding bills from these companies.  You will be contacted with the  lab results as soon as they are available. The fastest way to get your results is to activate your My Chart account. Instructions are located on the last page of this paperwork. If you have not heard from [place] regarding the results in 2 weeks, please contact this office.

## 2016-09-06 ENCOUNTER — Ambulatory Visit (INDEPENDENT_AMBULATORY_CARE_PROVIDER_SITE_OTHER): Payer: 59 | Admitting: Family Medicine

## 2016-09-06 VITALS — BP 102/66 | HR 96 | Temp 99.1°F | Resp 18 | Ht 63.0 in | Wt 100.0 lb

## 2016-09-06 DIAGNOSIS — R112 Nausea with vomiting, unspecified: Secondary | ICD-10-CM

## 2016-09-06 DIAGNOSIS — R059 Cough, unspecified: Secondary | ICD-10-CM

## 2016-09-06 DIAGNOSIS — R05 Cough: Secondary | ICD-10-CM | POA: Diagnosis not present

## 2016-09-06 MED ORDER — BENZONATATE 100 MG PO CAPS: 100.0000 mg | ORAL_CAPSULE | Freq: Three times a day (TID) | ORAL | 0 refills | Status: DC | PRN

## 2016-09-06 MED ORDER — ONDANSETRON 4 MG PO TBDP: 4.0000 mg | ORAL_TABLET | Freq: Three times a day (TID) | ORAL | 0 refills | Status: DC | PRN

## 2016-09-06 NOTE — Patient Instructions (Addendum)
Zofran 4 mg every 8 hours as needed for nausea.  Benzonatate 100 mg up to 3 times daily as needed for cough.   IF you received an x-ray today, you will receive an invoice from [facility]. Please contact [facility] at [phone] with questions or concerns regarding your invoice.   IF you received labwork today, you will receive an invoice from [facility]. Please contact [facility] at [phone] with questions or concerns regarding your invoice.   Our billing staff will not be able to assist you with questions regarding bills from these companies.  You will be contacted with the lab results as soon as they are available. The fastest way to get your results is to activate your My Chart account. Instructions are located on the last page of this paperwork. If you have not heard from us regarding the results in 2 weeks, please contact this office.     Upper Respiratory Infection, Adult Most upper respiratory infections (URIs) are caused by a virus. A URI affects the nose, throat, and upper air passages. The most common type of URI is often called "the common cold." HOME CARE   Take medicines only as told by your doctor.  Gargle warm saltwater or take cough drops to comfort your throat as told by your doctor.  Use a warm mist humidifier or inhale steam from a shower to increase air moisture. This may make it easier to breathe.  Drink enough fluid to keep your pee (urine) clear or pale yellow.  Eat soups and other clear broths.  Have a healthy diet.  Rest as needed.  Go back to work when your fever is gone or your doctor says it is okay.  You may need to stay home longer to avoid giving your URI to others.  You can also wear a face mask and wash your hands often to prevent spread of the virus.  Use your inhaler more if you have asthma.  Do not use any tobacco products, including cigarettes, chewing tobacco, or electronic cigarettes. If  you need help quitting, ask your doctor. GET HELP IF:  You are getting worse, not better.  Your symptoms are not helped by medicine.  You have chills.  You are getting more short of breath.  You have brown or red mucus.  You have yellow or brown discharge from your nose.  You have pain in your face, especially when you bend forward.  You have a fever.  You have puffy (swollen) neck glands.  You have pain while swallowing.  You have white areas in the back of your throat. GET HELP RIGHT AWAY IF:   You have very bad or constant:  Headache.  Ear pain.  Pain in your forehead, behind your eyes, and over your cheekbones (sinus pain).  Chest pain.  You have long-lasting (chronic) lung disease and any of the following:  Wheezing.  Long-lasting cough.  Coughing up blood.  A change in your usual mucus.  You have a stiff neck.  You have changes in your:  Vision.  Hearing.  Thinking.  Mood. MAKE SURE YOU:   Understand these instructions.  Will watch your condition.  Will get help right away if you are not doing well or get worse.   This information is not intended to replace advice given to you by your health care provider. Make sure you discuss any questions you have with your health care provider.   Document Released: 04/01/2008 Document Revised: 02/28/2015 Document Reviewed:  01/19/2014 Elsevier Interactive Patient Education [employer]2016 Elsevier Inc.

## 2016-09-06 NOTE — Progress Notes (Signed)
Patient ID: Hayley Jensen, female    DOB: 1996-01-14, 20 y.o.   MRN: [id number]  PCP: No PCP Per Patient  Chief Complaint  Patient presents with  . Cough  . GI Problem  . Sore Throat    Subjective:   HPI 20 year old female presents for evaluation of cough, sore throat, and abdominal complaint x 3 days ago. Ears popped really loud times 3 days ago. Started coughing yesterday unproductive. With dinner last night diarrhea and nausea with/vomiting. Still feeling ill today. Loud abdominal sounds. Sore throat started today has progressed throughout the day. Reports decreased appetite and feeling fatigue.  Social History   Social History  . Marital status: Single    Spouse name: N/A  . Number of children: N/A  . Years of education: N/A   Occupational History  . Not on file.   Social History Main Topics  . Smoking status: Never Smoker  . Smokeless tobacco: Never Used  . Alcohol use 0.6 oz/week    1 Glasses of wine per week  . Drug use: No  . Sexual activity: Yes    Birth control/ protection: Pill   Other Topics Concern  . Not on file   Social History Narrative  . No narrative on file    History reviewed. No pertinent family history.  Review of Systems See HPI Patient Active Problem List   Diagnosis Date Noted  . Dysmenorrhea 05/18/2016  . 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 Cyst of right ovary 04/21/2014     Prior to Admission medications   Medication Sig Start Date End Date Taking? Authorizing Provider  albuterol (PROVENTIL HFA;VENTOLIN HFA) 108 (90 Base) MCG/ACT inhaler Inhale into the lungs.   Yes Historical Provider, MD  amphetamine-dextroamphetamine (ADDERALL) 20 MG tablet Take by mouth.   Yes Historical Provider, MD  busPIRone (BUSPAR) 7.5 MG tablet Take 7.5 mg by mouth daily as needed. For anxiety   Yes Historical Provider, MD  HYDROcodone-acetaminophen (NORCO/VICODIN) 5-325 MG tablet Take by mouth. 01/10/16  01/09/17 Yes Historical Provider, MD  naproxen (NAPROSYN) 500 MG tablet Take by mouth. 11/05/15  Yes Historical Provider, MD  Norethindrone-Ethinyl Estradiol-Fe (KAITLIB FE) 0.8-25 MG-MCG tablet  04/22/16  Yes Historical Provider, MD  nystatin cream (MYCOSTATIN) Apply topically. 01/06/16 01/05/17 Yes Historical Provider, MD  triamcinolone cream (KENALOG) 0.1 % Apply 1 application topically 2 (two) times daily. 05/18/16  Yes Shade FloodJeffrey R Greene, MD  valACYclovir (VALTREX) 500 MG tablet Take 1 tablet (500 mg total) by mouth 2 (two) times daily. Take for three days.  Repeat course at first sign of a new outbreak. 02/29/16  Yes Ofilia NeasMichael L Clark, PA-C     Allergies  Allergen Reactions  . Wellbutrin [Bupropion] Nausea And Vomiting       Objective:  Physical Exam  Constitutional: She is oriented to person, place, and time. She appears well-developed and well-nourished.  HENT:  Head: Normocephalic and atraumatic.  Right Ear: External ear normal.  Left Ear: External ear normal.  Nose: Nose normal.  Mouth/Throat: Oropharynx is clear and moist.  Eyes: Conjunctivae are normal. Pupils are equal, round, and reactive to light.  Neck: Normal range of motion. Neck supple.  Cardiovascular: Normal rate, regular rhythm, normal heart sounds and intact distal pulses.   Pulmonary/Chest: Effort normal and breath sounds normal.  Abdominal: Soft. Bowel sounds are normal. She exhibits no distension. There is no tenderness. There is no rebound.  Musculoskeletal: Normal range of motion.  Neurological: She is  alert and oriented to person, place, and time.  Skin: Skin is warm and dry.  Psychiatric: She has a normal mood and affect. Her behavior is normal. Judgment and thought content normal.     Vitals:   09/06/16 1500  BP: 102/66  Pulse: 96  Resp: 18  Temp: 99.1 F (37.3 C)   Assessment and Plan 1. Nausea and vomiting, intractability of vomiting not specified, unspecified vomiting type 2. Cough  Likely a viral  illness. Will treat symptomatically.   Plan: -Benzonatate (Tessalon) 100-200 mg up to 3 times daily for cough. -Zofran 4 mg up to every 8 hours as needed for nausea.  Return for follow-up of symptoms worsen or doesn't improve.  Godfrey PickKimberly S. Tiburcio PeaHarris, MSN, FNP-C [facility]

## 2017-07-22 ENCOUNTER — Telehealth: Payer: Self-pay | Admitting: *Deleted

## 2017-07-22 ENCOUNTER — Telehealth: Payer: Self-pay | Admitting: Physician Assistant

## 2017-07-22 NOTE — Telephone Encounter (Signed)
Pt is calling to see if we could get a refill of her valacyclovir.  Pt no longer lives here and she is having another break out and the leftover prescription she has had expired.  She has an appointment with a new primary doctor but it won't be for another 3 weeks.  If she could get a temporary refill, please call in to the [facility] [address] [place]

## 2017-07-22 NOTE — Telephone Encounter (Signed)
Per last message taken by [facility]. Pt called back to have her former message disregarded. She was requesting Valacyclovir, but she now resides in [place], [place]. She  saw a doctor there.

## 2017-07-22 NOTE — Telephone Encounter (Signed)
PATIENT CALLED BACK TO SEE IF THE MEDICATION SHE IS REQUESTING HAD BEEN CALLED INTO [facility]. I TOLD HER IT HAD NOT BEEN DONE YET. SHE SAID TO JUST DISREGARD THE MESSAGE BECAUSE SHE NEEDS THE MEDICINE NOW SINCE SHE HAS A BREAK OUT NOW. SHE WILL SEE A DOCTOR WHERE SHE IS.

## 2017-12-11 IMAGING — DX DG SACRUM/COCCYX 2+V
3 series · 3 of 3 positions shown · non-contrast
Comparison: None.

CLINICAL DATA: Status post slip and fall today on ice with the
buttock injury. Coccygeal pain. Initial encounter.

EXAM:
SACRUM AND COCCYX - 2+ VIEW

[coccyx ap]
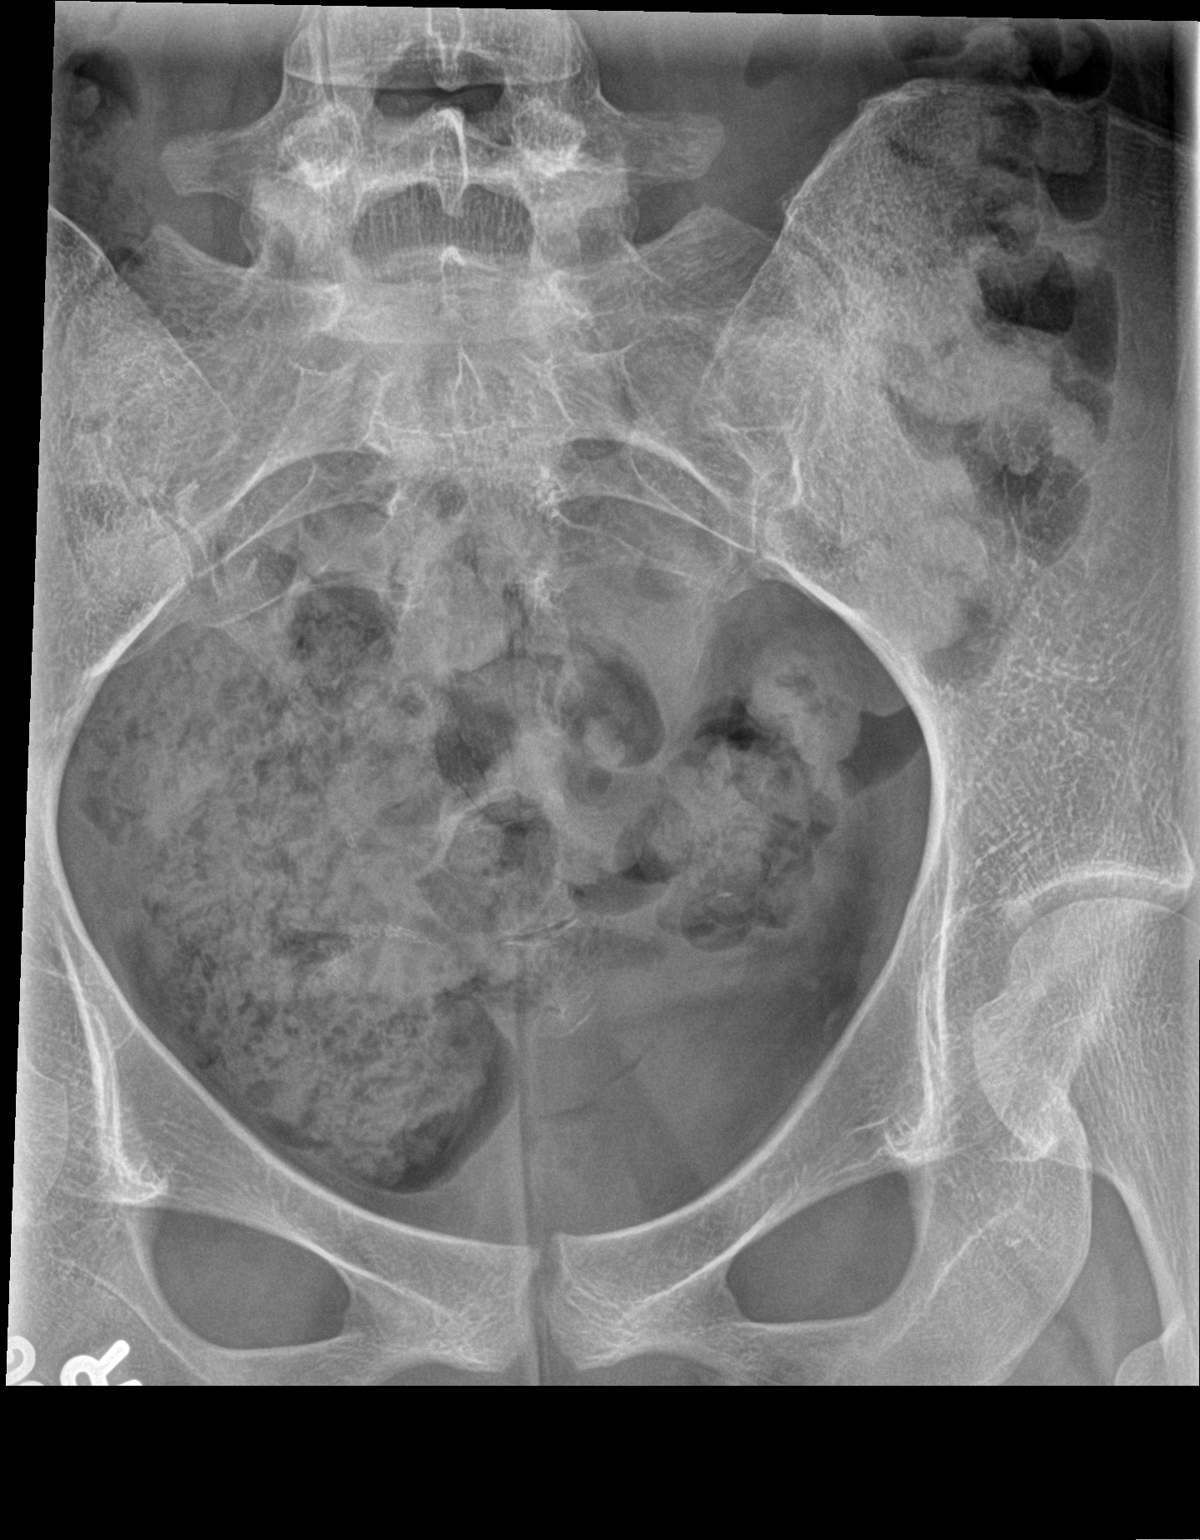

[sacrum ap]
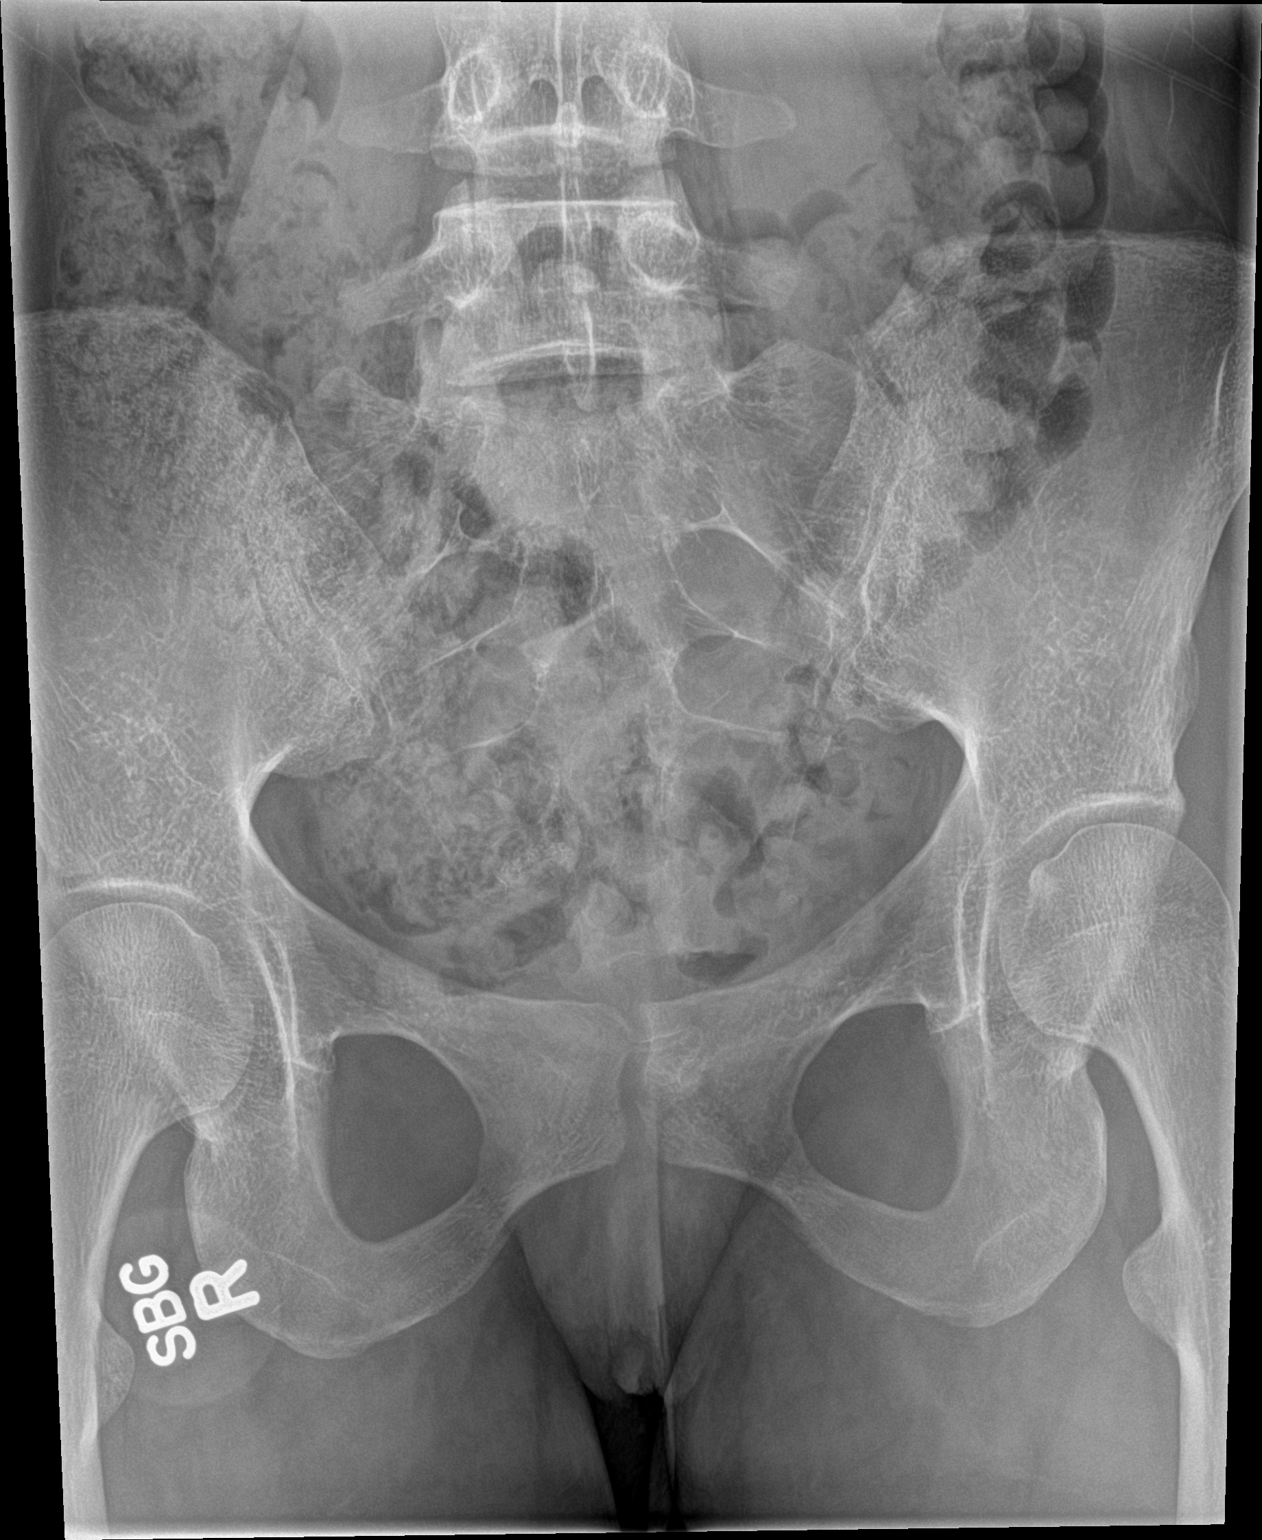

[sacrum lat]
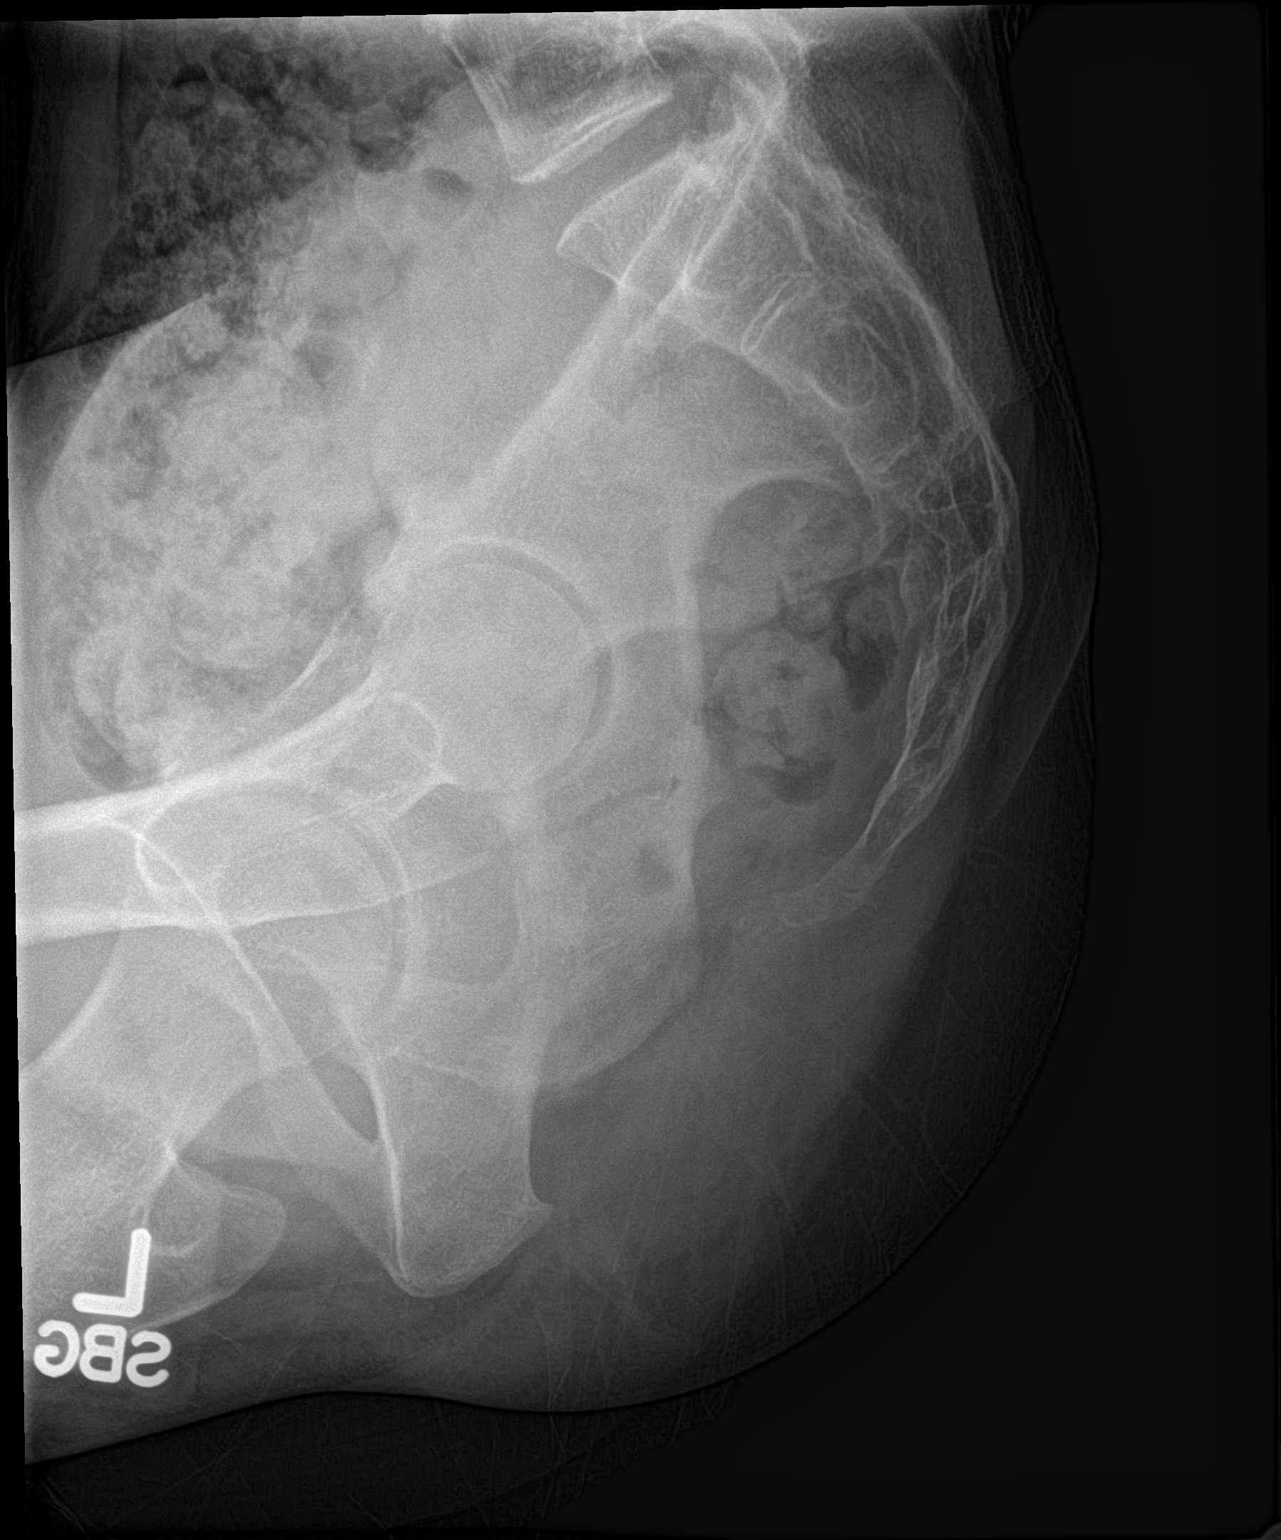

[3 of 3 positions shown; findings below may reference images not displayed]

FINDINGS: There is no evidence of fracture or other focal bone lesions.
IMPRESSION: Negative exam.

## 2018-07-01 ENCOUNTER — Encounter: Payer: Self-pay | Admitting: Family Medicine

## 2018-07-01 ENCOUNTER — Ambulatory Visit: Payer: BLUE CROSS/BLUE SHIELD | Admitting: Family Medicine

## 2018-07-01 ENCOUNTER — Other Ambulatory Visit: Payer: Self-pay

## 2018-07-01 VITALS — BP 105/72 | HR 88 | Temp 99.1°F | Resp 17 | Ht 63.0 in | Wt 95.8 lb

## 2018-07-01 DIAGNOSIS — R110 Nausea: Secondary | ICD-10-CM | POA: Diagnosis not present

## 2018-07-01 DIAGNOSIS — G43119 Migraine with aura, intractable, without status migrainosus: Secondary | ICD-10-CM | POA: Diagnosis not present

## 2018-07-01 MED ORDER — ONDANSETRON 4 MG PO TBDP: ORAL_TABLET | ORAL | 0 refills | Status: DC

## 2018-07-01 MED ORDER — KETOROLAC TROMETHAMINE 30 MG/ML IJ SOLN
30.0000 mg | Freq: Once | INTRAMUSCULAR | Status: AC
  Administered 2018-07-01: 30 mg via INTRAMUSCULAR

## 2018-07-01 MED ORDER — KETOROLAC TROMETHAMINE 60 MG/2ML IM SOLN: 60.0000 mg | Freq: Once | INTRAMUSCULAR | Status: DC

## 2018-07-01 MED ORDER — PROMETHAZINE HCL 25 MG/ML IJ SOLN
25.0000 mg | Freq: Once | INTRAMUSCULAR | Status: AC
Start: 2018-07-01 — End: 2018-07-01
  Administered 2018-07-01: 25 mg via INTRAMUSCULAR

## 2018-07-01 MED ORDER — HYDROCODONE-ACETAMINOPHEN 5-325 MG PO TABS: 1.0000 | ORAL_TABLET | ORAL | 0 refills | Status: DC | PRN

## 2018-07-01 NOTE — Patient Instructions (Addendum)
  Drink plenty of fluids and rest in a dark area.  You have received Toradol 60 mg and Phenergan 25 mg in the office  Take Zofran 4 mg every 6 or 8 hours as needed for nausea (ondansetron)  Take Norco 5 mg 1 every 4-6 hours if needed for severe pain only  Return or go to the emergency room if necessary for worsening symptoms  Tylenol or ibuprofen for more minor pain.  These note that you probably should keep your Tylenol (acetaminophen) and take less than 3000 mg daily, and the Norco (hydrocodone/acetaminophen) has acetaminophen in it so total that in your total daily count.    If you have lab work done today you will be contacted with your lab results within the next 2 weeks.  If you have not heard from [place] then please contact us. The fastest way to get your results is to register for My Chart.   IF you received an x-ray today, you will receive an invoice from [facility]. Please contact [facility] at [phone] with questions or concerns regarding your invoice.   IF you received labwork today, you will receive an invoice from [place]. Please contact [facility] at [phone] with questions or concerns regarding your invoice.   Our billing staff will not be able to assist you with questions regarding bills from these companies.  You will be contacted with the lab results as soon as they are available. The fastest way to get your results is to activate your My Chart account. Instructions are located on the last page of this paperwork. If you have not heard from [place] regarding the results in 2 weeks, please contact this office.

## 2018-07-01 NOTE — Progress Notes (Signed)
Patient ID: Hayley Jensen, female    DOB: [birth year]  Age: 22 y.o. MRN: [id number]  Chief Complaint  Patient presents with  . migraine x yesterday onset.    Pt evacuated this morning from [facility].  Hx of severe migraines once every few years, taking extra strength tylenol for pain-last night and this morning.  Nausea and emesis once today, hx of reflux after eqating and drinking.  Per pt she had vision checked recently.  Pt does have sensitivity to light and sound.  Pain level 7/10.  Per pt not being able to see has to wear sun glasses and not able to keep anything down.    Subjective:   Patient had a migraine starting yesterday morning.  She was still working before evacuating.  They moved up here from [facility] to stay at her boyfriend's family.  She has migraines every several years.  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  She has no history of drug problems.  Usually does not take all her pain medicines when she has had surgical meds.  She felt a little better after drinking a little bit of beer last night, but the headache has returned and persisted all day the day.  She is been nauseous and vomited a small amount once.  She has photophobia.  She had an aura.  Current allergies, medications, problem list, past/family and social histories reviewed.  Objective:  BP 105/72 (BP Location: Right Arm, Patient Position: Sitting, Cuff Size: Normal)   Pulse 88   Temp 99.1 F (37.3 C) (Oral)   Resp 17   Ht 5\' 3"  (1.6 m)   Wt 95 lb 12.8 oz (43.5 kg)   LMP 06/24/2018   SpO2 100%   BMI 16.97 kg/m   Does not feel well.  TMs normal.  Eyes PRL.  EOMs intact.  Fundi look benign.  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  Her throat is clear.  Neck supple without significant nodes.  Chest is clear to auscultation.  Heart regular without murmur.  No CVA tenderness.  Fully alert and oriented.  Assessment & Plan:    Assessment: 1. Intractable migraine with aura without status migrainosus   2. Nausea without vomiting       Plan: Give her a shot of Toradol and Phenergan and send her on her way with a prescription for pain medications and Zofran.  Return if needed.  Instructions.  No orders of the defined types were placed in this encounter.   Meds ordered this encounter  Medications  . DISCONTD: ketorolac (TORADOL) injection 60 mg  . promethazine (PHENERGAN) injection 25 mg  . ketorolac (TORADOL) 30 MG/ML injection 30 mg  . DISCONTD: HYDROcodone-acetaminophen (NORCO) 5-325 MG tablet    Sig: Take 1 tablet by mouth every 4 (four) hours as needed.    Dispense:  12 tablet    Refill:  0  . ondansetron (ZOFRAN ODT) 4 MG disintegrating tablet    Sig: Take 1 every 6 hours as needed for nausea    Dispense:  20 tablet    Refill:  0  . HYDROcodone-acetaminophen (NORCO) 5-325 MG tablet    Sig: Take 1 tablet by mouth every 4 (four) hours as needed.    Dispense:  12 tablet    Refill:  0         Patient Instructions    Drink  plenty of fluids and rest in a dark area.  You have received Toradol 60 mg and Phenergan 25 mg in the office  Take Zofran 4 mg every 6 or 8 hours as needed for nausea (ondansetron)  Take Norco 5 mg 1 every 4-6 hours if needed for severe pain only  Return or go to the emergency room if necessary for worsening symptoms  Tylenol or ibuprofen for more minor pain.  These note that you probably should keep your Tylenol (acetaminophen) and take less than 3000 mg daily, and the Norco (hydrocodone/acetaminophen) has acetaminophen in it so total that in your total daily count.    If you have lab work done today you will be contacted with your lab results within the next 2 weeks.  If you have not heard from [place] then please contact us. The fastest way to get your results is to register for My Chart.   IF you received an x-ray today, you will receive an invoice from  [facility]. Please contact [facility] at [phone] with questions or concerns regarding your invoice.   IF you received labwork today, you will receive an invoice from [place]. Please contact [facility] at [phone] with questions or concerns regarding your invoice.   Our billing staff will not be able to assist you with questions regarding bills from these companies.  You will be contacted with the lab results as soon as they are available. The fastest way to get your results is to activate your My Chart account. Instructions are located on the last page of this paperwork. If you have not heard from [place] regarding the results in 2 weeks, please contact this office.        Return if symptoms worsen or fail to improve.   Janace Hoard, MD 07/01/2018

## 2018-07-03 ENCOUNTER — Ambulatory Visit: Payer: BLUE CROSS/BLUE SHIELD | Admitting: Physician Assistant

## 2018-07-03 ENCOUNTER — Encounter: Payer: Self-pay | Admitting: Physician Assistant

## 2018-07-03 ENCOUNTER — Other Ambulatory Visit: Payer: Self-pay

## 2018-07-03 VITALS — BP 92/62 | HR 82 | Temp 98.8°F | Resp 16 | Ht 63.0 in | Wt 99.0 lb

## 2018-07-03 DIAGNOSIS — G43811 Other migraine, intractable, with status migrainosus: Secondary | ICD-10-CM

## 2018-07-03 MED ORDER — BUTALBITAL-APAP-CAFFEINE 50-325-40 MG PO TABS: 1.0000 | ORAL_TABLET | Freq: Four times a day (QID) | ORAL | 0 refills | Status: AC | PRN

## 2018-07-03 MED ORDER — PROMETHAZINE HCL 25 MG/ML IJ SOLN
25.0000 mg | Freq: Once | INTRAMUSCULAR | Status: AC
  Administered 2018-07-03: 25 mg via INTRAMUSCULAR

## 2018-07-03 MED ORDER — RIZATRIPTAN BENZOATE 10 MG PO TABS: 10.0000 mg | ORAL_TABLET | ORAL | 1 refills | Status: DC | PRN

## 2018-07-03 NOTE — Patient Instructions (Addendum)
Rizatriptan (Maxalt): Please take one tablet at the onset of your headache. If it does not improve the symptoms please take one additional tablet. Do not take more then 2 tablets in 24hrs. Do not take use more then 2 to 3 times in a week.  If the Maxalt doesn't work, after 24 hours you can try Fioricet.   Preventative: Magnesium citrate 400mg  to 600mg  daily, riboflavin 400mg  daily, Coenzyme Q 10 100mg  three times daily, Start Nortriptyline 25mg  at bedtime  To prevent or relieve headaches, try the following:  Cool Compress. Lie down and place a cool compress on your head.   Avoid headache triggers. If certain foods or odors seem to have triggered your migraines in the past, avoid them. A headache diary might help you identify triggers.   Include physical activity in your daily routine. Try a daily walk or other moderate aerobic exercise.   Manage stress. Find healthy ways to cope with the stressors, such as delegating tasks on your to-do list.   Practice relaxation techniques. Try deep breathing, yoga, massage and visualization.   Eat regularly. Eating regularly scheduled meals and maintaining a healthy diet might help prevent headaches. Also, drink plenty of fluids.   Follow a regular sleep schedule. Sleep deprivation might contribute to headaches  Consider biofeedback. With this mind-body technique, you learn to control certain bodily functions - such as muscle tension, heart rate and blood pressure - to prevent headaches or reduce headache pain.     Acetaminophen; Butalbital; Caffeine tablets or capsules What is this medicine? ACETAMINOPHEN; BUTALBITAL; CAFFEINE (a set a MEE noe fen; byoo TAL bi tal; KAF een) is a pain reliever. It is used to treat tension headaches. This medicine may be used for other purposes; ask your health care provider or pharmacist if you have questions. COMMON BRAND NAME(S): Alagesic, Americet, Anolor-300, Arcet, BAC, CAPACET, Dolgic Plus, Esgic, Esgic Plus,  Ezol, Fioricet, Ryder System, Medigesic, [place], [address] [place], Phrenilin Forte, Repan, [place], Triad, Zebutal What should I tell my health care provider before I take this medicine? They need to know if you have any of these conditions: -drug abuse or addiction -heart or circulation problems -if you often drink alcohol -kidney disease or problems going to the bathroom -liver disease -lung disease, asthma, or breathing problems -porphyria -an unusual or allergic reaction to acetaminophen, butalbital or other barbiturates, caffeine, other medicines, foods, dyes, or preservatives -pregnant or trying to get pregnant -breast-feeding How should I use this medicine? Take this medicine by mouth with a full glass of water. Follow the directions on the prescription label. If the medicine upsets your stomach, take the medicine with food or milk. Do not take more than you are told to take. Talk to your pediatrician regarding the use of this medicine in children. Special care may be needed. Overdosage: If you think you have taken too much of this medicine contact a poison control center or emergency room at once. NOTE: This medicine is only for you. Do not share this medicine with others. What if I miss a dose? If you miss a dose, take it as soon as you can. If it is almost time for your next dose, take only that dose. Do not take double or extra doses. What may interact with this medicine? -alcohol or medicines that contain alcohol -antidepressants, especially MAOIs like isocarboxazid, phenelzine, tranylcypromine, and selegiline -antihistamines -benzodiazepines -carbamazepine -isoniazid -medicines for pain like pentazocine, buprenorphine, butorphanol, nalbuphine, tramadol, and propoxyphene -muscle relaxants -naltrexone -phenobarbital, phenytoin, and fosphenytoin -phenothiazines like  perphenazine, thioridazine, chlorpromazine, mesoridazine, fluphenazine, prochlorperazine, promazine, and  trifluoperazine -voriconazole This list may not describe all possible interactions. Give your health care provider a list of all the medicines, herbs, non-prescription drugs, or dietary supplements you use. Also tell them if you smoke, drink alcohol, or use illegal drugs. Some items may interact with your medicine. What should I watch for while using this medicine? Tell your doctor or health care professional if your pain does not go away, if it gets worse, or if you have new or a different type of pain. You may develop tolerance to the medicine. Tolerance means that you will need a higher dose of the medicine for pain relief. Tolerance is normal and is expected if you take the medicine for a long time. Do not suddenly stop taking your medicine because you may develop a severe reaction. Your body becomes used to the medicine. This does NOT mean you are addicted. Addiction is a behavior related to getting and using a drug for a non-medical reason. If you have pain, you have a medical reason to take pain medicine. Your doctor will tell you how much medicine to take. If your doctor wants you to stop the medicine, the dose will be slowly lowered over time to avoid any side effects. You may get drowsy or dizzy when you first start taking the medicine or change doses. Do not drive, use machinery, or do anything that may be dangerous until you know how the medicine affects you. Stand or sit up slowly. Do not take other medicines that contain acetaminophen with this medicine. Always read labels carefully. If you have questions, ask your doctor or pharmacist. If you take too much acetaminophen get medical help right away. Too much acetaminophen can be very dangerous and cause liver damage. Even if you do not have symptoms, it is important to get help right away. What side effects may I notice from receiving this medicine? Side effects that you should report to your doctor or health care professional as soon as  possible: -allergic reactions like skin rash, itching or hives, swelling of the face, lips, or tongue -breathing problems -confusion -feeling faint or lightheaded, falls -redness, blistering, peeling or loosening of the skin, including inside the mouth -seizure -stomach pain -yellowing of the eyes or skin Side effects that usually do not require medical attention (report to your doctor or health care professional if they continue or are bothersome): -constipation -nausea, vomiting This list may not describe all possible side effects. Call your doctor for medical advice about side effects. You may report side effects to FDA at 1-800-FDA-1088. Where should I keep my medicine? Keep out of the reach of children. This medicine can be abused. Keep your medicine in a safe place to protect it from theft. Do not share this medicine with anyone. Selling or giving away this medicine is dangerous and against the law. This medicine may cause accidental overdose and death if it taken by other adults, children, or pets. Mix any unused medicine with a substance like cat litter or coffee grounds. Then throw the medicine away in a sealed container like a sealed bag or a coffee can with a lid. Do not use the medicine after the expiration date. Store at room temperature between 15 and 30 degrees C (59 and 86 degrees F). NOTE: This sheet is a summary. It may not cover all possible information. If you have questions about this medicine, talk to your doctor, pharmacist, or health care provider.  2018 Elsevier/Gold Standard (2013-12-10 15:00:25)  If you have lab work done today you will be contacted with your lab results within the next 2 weeks.  If you have not heard from [place] then please contact us. The fastest way to get your results is to register for My Chart.   IF you received an x-ray today, you will receive an invoice from [facility]. Please contact [facility] at [phone] with questions  or concerns regarding your invoice.   IF you received labwork today, you will receive an invoice from [place]. Please contact [facility] at [phone] with questions or concerns regarding your invoice.   Our billing staff will not be able to assist you with questions regarding bills from these companies.  You will be contacted with the lab results as soon as they are available. The fastest way to get your results is to activate your My Chart account. Instructions are located on the last page of this paperwork. If you have not heard from [place] regarding the results in 2 weeks, please contact this office.

## 2018-07-03 NOTE — Progress Notes (Signed)
Hayley Jensen  MRN: [id number] DOB: 1996-05-10  PCP: Patient, No Pcp Per  Subjective:  Pt is a 22 year old female who presents to clinic for f/u migraines. She has a h/o severe migraines.   She was here two days ago and saw Dr. Alwyn Ren. Has not taken hydrocodone - doesn't like the strong pain meds. "Feels like my head is going to cave in".  Endorses nausea and shakiness. One episode of vomiting.  She has photophobia.  She had an aura. She is eating fine.  4 bottles water/day and juice.   Review of Systems  Gastrointestinal: Positive for nausea and vomiting. Negative for abdominal pain.  Neurological: Positive for headaches. Negative for dizziness.    Patient Active Problem List   Diagnosis Date Noted  . Dysmenorrhea 05/18/2016  . 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 Cyst of right ovary 04/21/2014    Current Outpatient Medications on File Prior to Visit  Medication Sig Dispense Refill  . albuterol (PROVENTIL HFA;VENTOLIN HFA) 108 (90 Base) MCG/ACT inhaler Inhale into the lungs.    Marland Kitchen amphetamine-dextroamphetamine (ADDERALL) 20 MG tablet Take by mouth.    . benzonatate (TESSALON) 100 MG capsule Take 1-2 capsules (100-200 mg total) by mouth 3 (three) times daily as needed for cough. 40 capsule 0  . busPIRone (BUSPAR) 7.5 MG tablet Take 7.5 mg by mouth daily as needed. For anxiety    . naproxen (NAPROSYN) 500 MG tablet Take by mouth.    . ondansetron (ZOFRAN ODT) 4 MG disintegrating tablet Take 1 every 6 hours as needed for nausea 20 tablet 0  . valACYclovir (VALTREX) 500 MG tablet Take 1 tablet (500 mg total) by mouth 2 (two) times daily. Take for three days.  Repeat course at first sign of a new outbreak. 30 tablet 3  . HYDROcodone-acetaminophen (NORCO) 5-325 MG tablet Take 1 tablet by mouth every 4 (four) hours as needed. (Patient not taking: Reported on 07/03/2018) 12 tablet 0  . Norethindrone-Ethinyl Estradiol-Fe (KAITLIB FE) 0.8-25  MG-MCG tablet      No current facility-administered medications on file prior to visit.     Allergies  Allergen Reactions  . Wellbutrin [Bupropion] Nausea And Vomiting     Objective:  BP 92/62 (BP Location: Left Arm, Patient Position: Sitting, Cuff Size: Normal)   Pulse 82   Temp 98.8 F (37.1 C) (Oral)   Resp 16   Ht 5\' 3"  (1.6 m)   Wt 99 lb (44.9 kg)   LMP 06/24/2018   SpO2 99%   BMI 17.54 kg/m   Physical Exam  Constitutional: She is oriented to person, place, and time. No distress.  Eyes: Pupils are equal, round, and reactive to light. Conjunctivae and EOM are normal.  Cardiovascular: Normal rate, regular rhythm and normal heart sounds.  Neurological: She is alert and oriented to person, place, and time. She has normal strength.  Skin: Skin is warm and dry.  Psychiatric: Judgment normal.  Vitals reviewed.   Assessment and Plan :  1. Other migraine with status migrainosus, intractable - Pt presents f/u migraine. She was here yesterday and received IM phenergan and toradol - improved symptoms but they returned. She endorses about 85% improvement following IV fluids and IM phanergan. Rx for fioricet and maxalt. Medication SE profile discussed. RTC PRN.   - PR NORMAL SALINE SOLUTION INFUS - PR CATH IMPL VASC ACCESS PORTAL - promethazine (PHENERGAN) injection 25 mg - butalbital-acetaminophen-caffeine (FIORICET, ESGIC) 50-325-40 MG tablet; Take 1-2 tablets  by mouth every 6 (six) hours as needed for headache.  Dispense: 20 tablet; Refill: 0 - rizatriptan (MAXALT) 10 MG tablet; Take 1 tablet (10 mg total) by mouth as needed for migraine. May repeat in 2 hours if needed  Dispense: 10 tablet; Refill: 1   Whitney Nao Linz, PA-C  [facility] 07/03/2018 12:38 PM  Please note: Portions of this report may have been transcribed using dragon voice recognition software. Every effort was made to ensure accuracy; however, inadvertent computerized  transcription errors may be present.

## 2019-02-09 ENCOUNTER — Telehealth (INDEPENDENT_AMBULATORY_CARE_PROVIDER_SITE_OTHER): Payer: BLUE CROSS/BLUE SHIELD | Admitting: Family Medicine

## 2019-02-09 ENCOUNTER — Other Ambulatory Visit: Payer: Self-pay

## 2019-02-09 DIAGNOSIS — G43709 Chronic migraine without aura, not intractable, without status migrainosus: Secondary | ICD-10-CM | POA: Diagnosis not present

## 2019-02-09 DIAGNOSIS — G43811 Other migraine, intractable, with status migrainosus: Secondary | ICD-10-CM

## 2019-02-09 NOTE — Progress Notes (Signed)
Telemedicine Encounter- SOAP NOTE Established Patient  This telephone encounter was conducted with the patient's (or proxy's) verbal consent via audio telecommunications: yes/no: Yes Patient was instructed to have this encounter in a suitably private space; and to only have persons present to whom they give permission to participate. In addition, patient identity was confirmed by use of name plus two identifiers (DOB and address).  I discussed the limitations, risks, security and privacy concerns of performing an evaluation and management service by telephone and the availability of in person appointments. I also discussed with the patient that there may be a patient responsible charge related to this service. The patient expressed understanding and agreed to proceed.  I spent a total of TIME; 0 MIN TO 60 MIN: 20 minutes talking with the patient or their proxy.  CC: headache  Subjective   Hayley Jensen is a 23 y.o. established patient. Telephone visit today for  HPI Migraine She reports that she has a history of migraines that started at age 23 She states that she had very few headaches, maybe once every couple of years Now they are occurring 7-10 days a month She has been keeping a headache journal and tracking symptoms She states that she does not see a pattern She reports that it does not seem triggered by her periods or ovulation  She reports that the headaches come on quickly and are typically associated with nausea, vertigo, neck stiffness and tinnitus +photophobia Used to get an aura in the past +vision changes like her eyes don't  Focus and she cannot wear her contacts She gets increased thirst with headaches  No incontinence of urine She was given fioricet, malaxt SL and zofran 8mg  She states that she gets the maxalt 2 doses but this does not help. Her last eye exam was July 2019  Patient Active Problem List   Diagnosis Date Noted  . Dysmenorrhea 05/18/2016  . 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 Cyst of right ovary 04/21/2014    Past Medical History:  Diagnosis Date  . Anxiety   . Asthma   . Depression   . Ovarian cyst     Current Outpatient Medications  Medication Sig Dispense Refill  . albuterol (PROVENTIL HFA;VENTOLIN HFA) 108 (90 Base) MCG/ACT inhaler Inhale into the lungs.    Marland Kitchen. amphetamine-dextroamphetamine (ADDERALL) 20 MG tablet Take by mouth.    . benzonatate (TESSALON) 100 MG capsule Take 1-2 capsules (100-200 mg total) by mouth 3 (three) times daily as needed for cough. 40 capsule 0  . busPIRone (BUSPAR) 7.5 MG tablet Take 7.5 mg by mouth daily as needed. For anxiety    . butalbital-acetaminophen-caffeine (FIORICET, ESGIC) 50-325-40 MG tablet Take 1-2 tablets by mouth every 6 (six) hours as needed for headache. 20 tablet 0  . HYDROcodone-acetaminophen (NORCO) 5-325 MG tablet Take 1 tablet by mouth every 4 (four) hours as needed. (Patient not taking: Reported on 07/03/2018) 12 tablet 0  . naproxen (NAPROSYN) 500 MG tablet Take by mouth.    . ondansetron (ZOFRAN ODT) 4 MG disintegrating tablet Take 1 every 6 hours as needed for nausea 20 tablet 0  . valACYclovir (VALTREX) 500 MG tablet Take 1 tablet (500 mg total) by mouth 2 (two) times daily. Take for three days.  Repeat course at first sign of a new outbreak. 30 tablet 3   No current facility-administered medications for this visit.     Allergies  Allergen Reactions  . Wellbutrin [Bupropion] Nausea  And Vomiting    Social History   Socioeconomic History  . Marital status: Single    Spouse name: Not on file  . Number of children: Not on file  . Years of education: Not on file  . Highest education level: Not on file  Occupational History  . Not on file  Social Needs  . Financial resource strain: Not on file  . Food insecurity:    Worry: Not on file    Inability: Not on file  . Transportation needs:    Medical: Not on file     Non-medical: Not on file  Tobacco Use  . Smoking status: Never Smoker  . Smokeless tobacco: Never Used  Substance and Sexual Activity  . Alcohol use: Yes    Alcohol/week: 1.0 standard drinks    Types: 1 Glasses of wine per week  . Drug use: No  . Sexual activity: Yes    Birth control/protection: Pill  Lifestyle  . Physical activity:    Days per week: Not on file    Minutes per session: Not on file  . Stress: Not on file  Relationships  . Social connections:    Talks on phone: Not on file    Gets together: Not on file    Attends religious service: Not on file    Active member of club or organization: Not on file    Attends meetings of clubs or organizations: Not on file    Relationship status: Not on file  . Intimate partner violence:    Fear of current or ex partner: Not on file    Emotionally abused: Not on file    Physically abused: Not on file    Forced sexual activity: Not on file  Other Topics Concern  . Not on file  Social History Narrative  . Not on file    ROS Review of Systems  Constitutional: Negative for activity change, appetite change, chills and fever.  HENT: Negative for congestion, nosebleeds, trouble swallowing and voice change.   Respiratory: Negative for cough, shortness of breath and wheezing.   Gastrointestinal: Negative for diarrhea, nausea and vomiting.  Genitourinary: Negative for difficulty urinating, dysuria, flank pain and hematuria.  Musculoskeletal: Negative for back pain, joint swelling and neck pain.  Neurological: see hpi See HPI. All other review of systems negative.   Objective   Vitals as reported by the patient: There were no vitals filed for this visit.  Diagnoses and all orders for this visit:  Chronic migraine without aura without status migrainosus, not intractable -     Ambulatory referral to Optometry  Other migraine with status migrainosus, intractable   Patient advised to get eye exam Referral to be faxed  Discussed treatment options for preventative medication such as Beta blockers, calcium channel blockers and topamax For abortive therapy continue maxalt fioricet was ineffective  I discussed the assessment and treatment plan with the patient. The patient was provided an opportunity to ask questions and all were answered. The patient agreed with the plan and demonstrated an understanding of the instructions.   The patient was advised to call back or seek an in-person evaluation if the symptoms worsen or if the condition fails to improve as anticipated.  I provided 20 minutes of non-face-to-face time during this encounter.  Doristine Bosworth, MD  Primary Care at [facility]

## 2019-02-09 NOTE — Progress Notes (Signed)
Wants to talk about the migraines that she has been having, This is a reoccurring thing. Has been treated with oral meds and injections. Taking maxalt and fiorcet for the migraines, those meds don't seem to be working for the pain. Asking for something stronger. Went to urgent care and was given a sublingual maxalt that seem to work better however, never stopped the pain. Faxing records from that visit. Says she is nauseous, fatigue and stiffed necked.  Pharmacy has been verified, no travel, depression or anxiety outside of just dealing with current situation

## 2019-02-10 ENCOUNTER — Encounter: Payer: Self-pay | Admitting: Family Medicine

## 2019-03-09 ENCOUNTER — Encounter: Payer: Self-pay | Admitting: Family Medicine

## 2019-03-09 DIAGNOSIS — G43119 Migraine with aura, intractable, without status migrainosus: Secondary | ICD-10-CM | POA: Insufficient documentation

## 2019-04-20 ENCOUNTER — Telehealth: Payer: BLUE CROSS/BLUE SHIELD | Admitting: Registered Nurse

## 2019-05-31 ENCOUNTER — Encounter: Payer: Self-pay | Admitting: Family Medicine

## 2019-05-31 ENCOUNTER — Other Ambulatory Visit: Payer: Self-pay

## 2019-05-31 ENCOUNTER — Ambulatory Visit: Payer: BC Managed Care – PPO | Admitting: Family Medicine

## 2019-05-31 VITALS — BP 117/70 | HR 71 | Temp 98.5°F | Resp 17 | Ht 63.0 in | Wt 96.0 lb

## 2019-05-31 DIAGNOSIS — A6004 Herpesviral vulvovaginitis: Secondary | ICD-10-CM

## 2019-05-31 DIAGNOSIS — J45990 Exercise induced bronchospasm: Secondary | ICD-10-CM

## 2019-05-31 DIAGNOSIS — R300 Dysuria: Secondary | ICD-10-CM

## 2019-05-31 DIAGNOSIS — G43709 Chronic migraine without aura, not intractable, without status migrainosus: Secondary | ICD-10-CM

## 2019-05-31 DIAGNOSIS — R350 Frequency of micturition: Secondary | ICD-10-CM

## 2019-05-31 DIAGNOSIS — F902 Attention-deficit hyperactivity disorder, combined type: Secondary | ICD-10-CM | POA: Diagnosis not present

## 2019-05-31 DIAGNOSIS — R238 Other skin changes: Secondary | ICD-10-CM

## 2019-05-31 LAB — POCT URINALYSIS DIP (MANUAL ENTRY)
Bilirubin, UA: NEGATIVE
Blood, UA: NEGATIVE
Glucose, UA: NEGATIVE mg/dL
Ketones, POC UA: NEGATIVE mg/dL
Leukocytes, UA: NEGATIVE
Nitrite, UA: NEGATIVE
Protein Ur, POC: NEGATIVE mg/dL
Spec Grav, UA: 1.025 (ref 1.010–1.025)
Urobilinogen, UA: 0.2 E.U./dL
pH, UA: 7.5 (ref 5.0–8.0)

## 2019-05-31 LAB — POCT URINE PREGNANCY: Preg Test, Ur: NEGATIVE

## 2019-05-31 MED ORDER — ALBUTEROL SULFATE HFA 108 (90 BASE) MCG/ACT IN AERS: 1.0000 | INHALATION_SPRAY | Freq: Four times a day (QID) | RESPIRATORY_TRACT | 3 refills | Status: AC | PRN

## 2019-05-31 MED ORDER — NITROFURANTOIN MONOHYD MACRO 100 MG PO CAPS: 100.0000 mg | ORAL_CAPSULE | Freq: Two times a day (BID) | ORAL | 0 refills | Status: AC

## 2019-05-31 MED ORDER — AMPHETAMINE-DEXTROAMPHETAMINE 20 MG PO TABS: 20.0000 mg | ORAL_TABLET | Freq: Two times a day (BID) | ORAL | 0 refills | Status: DC

## 2019-05-31 MED ORDER — VALACYCLOVIR HCL 500 MG PO TABS: 500.0000 mg | ORAL_TABLET | Freq: Two times a day (BID) | ORAL | 3 refills | Status: DC

## 2019-05-31 NOTE — Progress Notes (Signed)
Established Patient Office Visit  Subjective:  Patient ID: Hayley Jensen, female    DOB: [birth year]  Age: 23 y.o. MRN: [id number]  CC:  Chief Complaint  Patient presents with  . adhd/anxiety    needs accomodations for school(bar exam) assessment needed  . urinary frequency and burning    onset: yesterday per pt prone to uti's  . Medication Refill    valtrex, adderall and albuterol inhaler    HPI Hayley Jensen presents for   ADHD Patient reports that [employer] needs her to get reassessment for ADHD She stats that her PCP completed a form for her accommodations which she used at her [employer] She states that she needs a reassessment  She reports that she was diagnosed around 2010 in high school. 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 She reports that when she takes adderall her ADHD is doing well She does well at work but not in lecture  She states that she figits a lot and feels like she is all over the place  Migraines  She was evaluated for migraines this summer but has not had the MRI for her evaluation She reports that she gets migraines about 7-10 days per month with severe nausea. 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  Exercise Induced Asthma She reports that she has exercise induced asthma She states that she does not have have an albuterol inhaler  The other one expired She is very well controlled   Past Medical History:  Diagnosis Date  . Anxiety   . Asthma   . Depression   . Ovarian cyst     Past Surgical History:  Procedure Laterality Date  . APPENDECTOMY      No family history on file.  Social History   Socioeconomic History  . Marital status: Single    Spouse name: Not on file  . Number of children: Not on file  . Years of education: Not on file  . Highest education level: Not on file  Occupational History  . Not on file  Social Needs  . Financial resource strain: Not on file  . Food insecurity    Worry: Not on file    Inability: Not on file  . Transportation needs    Medical: Not on file    Non-medical: Not on file  Tobacco Use  . Smoking status: Never Smoker  . Smokeless tobacco: Never  Used  Substance and Sexual Activity  . Alcohol use: Yes    Alcohol/week: 1.0 standard drinks    Types: 1 Glasses of wine per week  . Drug use: No  . Sexual activity: Yes    Birth control/protection: Pill  Lifestyle  . Physical activity    Days per week: Not on file    Minutes per session: Not on file  . Stress: Not on file  Relationships  . Social Musicianconnections    Talks on phone: Not on file    Gets together: Not on file    Attends religious service: Not on file    Active member of club or organization: Not on file    Attends meetings of clubs or organizations: Not on file    Relationship status: Not on file  . Intimate partner violence    Fear of current or ex partner: Not on file    Emotionally abused: Not on file    Physically abused: Not on file     Forced sexual activity: Not on file  Other Topics Concern  . Not on file  Social History Narrative  . Not on file    Outpatient Medications Prior to Visit  Medication Sig Dispense Refill  . benzonatate (TESSALON) 100 MG capsule Take 1-2 capsules (100-200 mg total) by mouth 3 (three) times daily as needed for cough. 40 capsule 0  . busPIRone (BUSPAR) 7.5 MG tablet Take 7.5 mg by mouth daily as needed. For anxiety    . butalbital-acetaminophen-caffeine (FIORICET, ESGIC) 50-325-40 MG tablet Take 1-2 tablets by mouth every 6 (six) hours as needed for headache. 20 tablet 0  . naproxen (NAPROSYN) 500 MG tablet Take by mouth.    . ondansetron (ZOFRAN ODT) 4 MG disintegrating tablet Take 1 every 6 hours as needed for nausea 20 tablet 0  . albuterol (PROVENTIL HFA;VENTOLIN HFA) 108 (90 Base) MCG/ACT inhaler Inhale into the lungs.    Marland Kitchen. amphetamine-dextroamphetamine (ADDERALL) 20 MG tablet Take by mouth.    . valACYclovir (VALTREX) 500 MG tablet Take 1 tablet (500 mg total) by mouth 2 (two) times daily. Take for three days.  Repeat course at first sign of a new outbreak. 30 tablet 3  . HYDROcodone-acetaminophen (NORCO) 5-325 MG tablet Take 1 tablet by mouth every 4 (four) hours as needed. (Patient not taking: Reported on 07/03/2018) 12 tablet 0   No facility-administered medications prior to visit.     Allergies  Allergen Reactions  . Wellbutrin [Bupropion] Nausea And Vomiting    ROS Review of Systems Review of Systems  Constitutional: Negative for activity change, appetite change, chills and fever.  HENT: Negative for congestion, nosebleeds, trouble swallowing and voice change.   Respiratory: Negative for cough, shortness of breath and wheezing.   Gastrointestinal: Negative for diarrhea, nausea and vomiting.  Genitourinary: Negative for difficulty urinating, dysuria, flank pain and hematuria.  Musculoskeletal: Negative for back pain, joint swelling and neck pain.  Neurological: Negative for  dizziness, speech difficulty, light-headedness and numbness. +headaches See HPI. All other review of systems negative.     Objective:    Physical Exam  BP 117/70 (BP Location: Left Arm, Patient Position: Sitting, Cuff Size: Normal)   Pulse 71   Temp 98.5 F (36.9 C) (Oral)   Resp 17   Ht 5\' 3"  (1.6 m)   Wt 96 lb (43.5 kg)   LMP 04/30/2019   SpO2 100%   BMI 17.01 kg/m  Wt Readings from Last 3 Encounters:  05/31/19 96 lb (  43.5 kg)  07/03/18 99 lb (44.9 kg)  07/01/18 95 lb 12.8 oz (43.5 kg)   Physical Exam  Constitutional: Oriented to person, place, and time. Appears well-developed and well-nourished.  HENT:  Head: Normocephalic and atraumatic.  Eyes: Conjunctivae and EOM are normal.  Cardiovascular: Normal rate, regular rhythm, normal heart sounds and intact distal pulses.  No murmur heard. Pulmonary/Chest: Effort normal and breath sounds normal. No stridor. No respiratory distress. Has no wheezes.  Neurological: Is alert and oriented to person, place, and time.  Skin: Skin is warm. Capillary refill takes less than 2 seconds.  Psychiatric: Has a normal mood and affect. Behavior is normal. Judgment and thought content normal.    Health Maintenance Due  Topic Date Due  . HIV Screening  02/21/2009  . TETANUS/TDAP  02/21/2013  . PAP-Cervical Cytology Screening  02/22/2015  . PAP SMEAR-Modifier  02/22/2015  . INFLUENZA VACCINE  05/29/2019    There are no preventive care reminders to display for this patient.     Assessment & Plan:   Problem List Items Addressed This Visit    None    Visit Diagnoses    Urinary frequency    -  Primary   Relevant Orders   POCT urinalysis dipstick (Completed)   Urine Culture   POCT urine pregnancy (Completed)   Attention deficit hyperactivity disorder (ADHD), combined type    -  Will refer to Psychology to reassess Discussed that reassessment helps to identify co-morbid conditions like OCD or Anxiety   Relevant Medications    amphetamine-dextroamphetamine (ADDERALL) 20 MG tablet   Other Relevant Orders   Ambulatory referral to Psychology   Dysuria       Relevant Orders   Urine Culture   POCT urine pregnancy (Completed)   Exercise-induced asthma    - continue albuterol prn, currently stable   Relevant Medications   albuterol (VENTOLIN HFA) 108 (90 Base) MCG/ACT inhaler   Chronic migraine without aura without status migrainosus, not intractable    -  Discussed that patient should continue with Neurology, will 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 amphetamine-dextroamphetamine (ADDERALL) 20 MG tablet    Sig: Take 1 tablet (20 mg total) by mouth 2 (two) times daily.    Dispense:  60 tablet    Refill:  0  . valACYclovir (VALTREX) 500 MG tablet    Sig: Take 1 tablet (500 mg total) by mouth 2 (two) times daily. Take for three days.  Repeat course at first sign of a new outbreak.    Dispense:  30 tablet    Refill:  3  . albuterol (VENTOLIN HFA) 108 (90 Base) MCG/ACT inhaler    Sig: Inhale 1 puff into the lungs every 6 (six) hours as needed for wheezing or shortness of breath.    Dispense:  18 g    Refill:  3    Follow-up: No follow-ups on file.  A total of 40 minutes were spent face-to-face with the patient during this encounter and over half of that time was spent on counseling and coordination of care.   Forrest Moron, MD

## 2019-06-01 LAB — URINE CULTURE: Organism ID, Bacteria: NO GROWTH

## 2019-06-22 ENCOUNTER — Ambulatory Visit: Payer: BC Managed Care – PPO | Admitting: Family Medicine

## 2019-06-22 ENCOUNTER — Encounter: Payer: Self-pay | Admitting: Family Medicine

## 2019-06-22 ENCOUNTER — Other Ambulatory Visit: Payer: Self-pay

## 2019-06-22 VITALS — BP 102/70 | HR 78 | Temp 99.0°F | Resp 12 | Wt 96.2 lb

## 2019-06-22 DIAGNOSIS — L299 Pruritus, unspecified: Secondary | ICD-10-CM

## 2019-06-22 MED ORDER — HYDROXYZINE HCL 25 MG PO TABS: ORAL_TABLET | ORAL | 0 refills | Status: AC

## 2019-06-22 NOTE — Patient Instructions (Addendum)
  Take hydroxyzine 12.5 mg 1 or 2 pills 3 times daily as needed for itching.  May cause a little drowsiness at the higher doses.  Return if worse.  Try to avoid getting hot and sweaty, which will make itching worse.   If you have lab work done today you will be contacted with your lab results within the next 2 weeks.  If you have not heard from [place] then please contact us. The fastest way to get your results is to register for My Chart.   IF you received an x-ray today, you will receive an invoice from [facility]. Please contact [facility] at [phone] with questions or concerns regarding your invoice.   IF you received labwork today, you will receive an invoice from [facility]. Please contact [facility] at [phone] with questions or concerns regarding your invoice.   Our billing staff will not be able to assist you with questions regarding bills from these companies.  You will be contacted with the lab results as soon as they are available. The fastest way to get your results is to activate your My Chart account. Instructions are located on the last page of this paperwork. If you have not heard from [place] regarding the results in 2 weeks, please contact this office.

## 2019-06-22 NOTE — Progress Notes (Signed)
Patient ID: Hayley Jensen, female    DOB: October 22, 1996  Age: 23 y.o. MRN: [id number]  Chief Complaint  Patient presents with  . Rash    rash on right thigh and rt arm. Area itch reaaly bad and very painful. Hx of shingles 10 years ago. Took Valtrex yday to try to jump start the process.    Subjective:   23 year old female who comes in with history of about last week developed itching.  It is on the right side of her body.  She had a lot of itching in the right lateral thigh and she scratches until it was excoriated.  There was no defined rash before that.  It now is a little tender to touch.  She also has had itching in her right flank, headache, mild on the right side of the body.  10 years ago she had a small focus of shingles on the right wrist, still has scarring from that.  She has attention deficit disorder, is in the hospital, and is taking her Adderall.  Current allergies, medications, problem list, past/family and social histories reviewed.  Objective:  BP 102/70 (BP Location: Right Arm, Patient Position: Sitting, Cuff Size: Normal)   Pulse 78   Temp 99 F (37.2 C) (Oral)   Resp 12   Wt 96 lb 3.2 oz (43.6 kg)   SpO2 98%   BMI 17.04 kg/m   No major distress.  No apparent rash.  Her vaginal area that was excoriated on the right lateral thigh.  I injected.  Assessment & Plan:   Assessment: 1. Pruritic condition       Plan: I think she has developed an itch, irritated, itch cycle and has developed more anxiety making it worse.  We will give her some hydroxyzine.  She does have a history of having had hives once in the past that she did have hydroxyzine then for that.  No orders of the defined types were placed in this encounter.   No orders of the defined types were placed in this encounter.        Patient Instructions       If you have lab work done today you will be contacted with your lab results within the next 2 weeks.  If you have not heard from [place]  then please contact us. The fastest way to get your results is to register for My Chart.   IF you received an x-ray today, you will receive an invoice from [facility]. Please contact [facility] at [phone] with questions or concerns regarding your invoice.   IF you received labwork today, you will receive an invoice from [place]. Please contact [facility] at [phone] with questions or concerns regarding your invoice.   Our billing staff will not be able to assist you with questions regarding bills from these companies.  You will be contacted with the lab results as soon as they are available. The fastest way to get your results is to activate your My Chart account. Instructions are located on the last page of this paperwork. If you have not heard from [place] regarding the results in 2 weeks, please contact this office.         No follow-ups on file.   Ruben Reason, MD 06/22/2019

## 2019-07-14 ENCOUNTER — Ambulatory Visit (INDEPENDENT_AMBULATORY_CARE_PROVIDER_SITE_OTHER)
Admission: RE | Admit: 2019-07-14 | Discharge: 2019-07-14 | Disposition: A | Discharge status: Home / Self Care | Payer: BC Managed Care – PPO | Source: Ambulatory Visit

## 2019-07-14 ENCOUNTER — Other Ambulatory Visit: Payer: Self-pay

## 2019-07-14 DIAGNOSIS — B379 Candidiasis, unspecified: Secondary | ICD-10-CM | POA: Diagnosis not present

## 2019-07-14 DIAGNOSIS — N898 Other specified noninflammatory disorders of vagina: Secondary | ICD-10-CM | POA: Diagnosis not present

## 2019-07-14 MED ORDER — FLUCONAZOLE 200 MG PO TABS: ORAL_TABLET | ORAL | 0 refills | Status: DC

## 2019-07-14 NOTE — Discharge Instructions (Signed)
Based on symptoms I will go ahead and treat for yeast infection Prescribed diflucan 200 mg once daily and then second dose 72 hours later Follow up with PCP or in person if symptoms persists Follow up in person or go to ER if you have any new or worsening symptoms fever, chills, nausea, vomiting, abdominal or pelvic pain, painful intercourse, vaginal discharge, vaginal bleeding, persistent symptoms despite treatment, etc..Marland Kitchen

## 2019-07-14 NOTE — ED Provider Notes (Signed)
[facility] Virtual Visit via Video Note:  Howard N Hartzell  initiated request for Telemedicine visit with [facility] team. I connected with Giovanna N Ferraris  on 07/14/2019 at 12:02 PM  for a synchronized telemedicine visit using a video enabled HIPPA compliant telemedicine application. I verified that I am speaking with [facility]  using two identifiers. [place]ittany Aber[place] Hafen, PA-C  was physically located in a [facility] site and [facility] was located at a different location.   The limitations of evaluation and management by telemedicine as well as the availability of in-person appointments were discussed. Patient was informed that she  may incur a bill ( including co-pay) for this virtual visit encounter. Gypsy N Bouillon  expressed understanding and gave verbal consent to proceed with virtual visit.  [id number] 07/14/19 Arrival Time: 1154   WG:NFAOZHYCC:VAGINAL DISCHARGE  SUBJECTIVE:  Hayley Jensen is a 23 y.o. female who presents with complaint of thick, creamy, white discharge that began 1 week ago.  Does admit to changing detergents recently.  Patient has been in a monogamous relationship for the past 4 years.  Denies concern for STDs.  Last sex 1 month ago.   She has tried OTC monistat with minimal relief.  Denies aggravating factors.  She reports similar symptoms in the past with yeast infections.  She denies fever, chills, nausea, vomiting, abdominal or pelvic pain, urinary symptoms, vaginal itching, vaginal odor, vaginal bleeding, dyspareunia, vaginal rashes or lesions.   LMP 06/13/2019 Current birth control method: None; no condom use  ROS: As per HPI.  All other pertinent ROS negative.     Past Medical History:  Diagnosis Date  . Anxiety   . Asthma   . Depression   . Ovarian cyst    Past Surgical History:  Procedure Laterality Date  . APPENDECTOMY     Allergies  Allergen Reactions  . Wellbutrin [Bupropion] Nausea And Vomiting   No current  facility-administered medications on file prior to encounter.    Current Outpatient Medications on File Prior to Encounter  Medication Sig Dispense Refill  . albuterol (VENTOLIN HFA) 108 (90 Base) MCG/ACT inhaler Inhale 1 puff into the lungs every 6 (six) hours as needed for wheezing or shortness of breath. 18 g 3  . amphetamine-dextroamphetamine (ADDERALL) 20 MG tablet Take 1 tablet (20 mg total) by mouth 2 (two) times daily. 60 tablet 0  . benzonatate (TESSALON) 100 MG capsule Take 1-2 capsules (100-200 mg total) by mouth 3 (three) times daily as needed for cough. 40 capsule 0  . busPIRone (BUSPAR) 7.5 MG tablet Take 7.5 mg by mouth daily as needed. For anxiety    . hydrOXYzine (ATARAX/VISTARIL) 25 MG tablet Take 1 or 2 pills 3 times daily as needed for itching. 40 tablet 0  . naproxen (NAPROSYN) 500 MG tablet Take by mouth.    . ondansetron (ZOFRAN ODT) 4 MG disintegrating tablet Take 1 every 6 hours as needed for nausea 20 tablet 0  . valACYclovir (VALTREX) 500 MG tablet Take 1 tablet (500 mg total) by mouth 2 (two) times daily. Take for three days.  Repeat course at first sign of a new outbreak. 30 tablet 3  . [DISCONTINUED] rizatriptan (MAXALT) 10 MG tablet Take 1 tablet (10 mg total) by mouth as needed for migraine. May repeat in 2 hours if needed 10 tablet 1    OBJECTIVE:  There were no vitals filed for this visit.  General appearance: alert; no distress Eyes:  EOMI grossly HENT: normocephalic; atraumatic Neck: supple with FROM Lungs: normal respiratory effort; speaking in full sentences without difficulty Extremities: moves extremities without difficulty Skin: No obvious rashes Neurologic: No facial asymmetries Psychological: alert and cooperative; normal mood and affect  ASSESSMENT & PLAN:  1. Vaginal discharge   2. Yeast infection     Meds ordered this encounter  Medications  . fluconazole (DIFLUCAN) 200 MG tablet    Sig: Take one dose by mouth, wait 72 hours, and then  take second dose by mouth    Dispense:  2 tablet    Refill:  0    Order Specific Question:   Supervising Provider    Answer:   Raylene Everts [[id number]]   Based on symptoms I will go ahead and treat for yeast infection Prescribed diflucan 200 mg once daily and then second dose 72 hours later Follow up with PCP or in person if symptoms persists Follow up in person or go to ER if you have any new or worsening symptoms fever, chills, nausea, vomiting, abdominal or pelvic pain, painful intercourse, vaginal discharge, vaginal bleeding, persistent symptoms despite treatment, etc...  I discussed the assessment and treatment plan with the patient. The patient was provided an opportunity to ask questions and all were answered. The patient agreed with the plan and demonstrated an understanding of the instructions.   The patient was advised to call back or seek an in-person evaluation if the symptoms worsen or if the condition fails to improve as anticipated.  I provided 5 minutes of non-face-to-face time during this encounter.  [place], PA-C  07/14/2019 12:02 PM       Lestine Box, PA-C 07/14/19 1205

## 2019-08-11 ENCOUNTER — Ambulatory Visit (INDEPENDENT_AMBULATORY_CARE_PROVIDER_SITE_OTHER): Payer: BC Managed Care – PPO | Admitting: Psychology

## 2019-08-11 DIAGNOSIS — F909 Attention-deficit hyperactivity disorder, unspecified type: Secondary | ICD-10-CM

## 2019-08-11 DIAGNOSIS — F4322 Adjustment disorder with anxiety: Secondary | ICD-10-CM

## 2019-08-24 ENCOUNTER — Ambulatory Visit: Payer: BC Managed Care – PPO | Admitting: Psychology

## 2019-08-26 ENCOUNTER — Ambulatory Visit: Payer: BC Managed Care – PPO | Admitting: Psychology

## 2019-09-06 ENCOUNTER — Ambulatory Visit: Payer: BC Managed Care – PPO | Admitting: Family Medicine

## 2019-09-08 ENCOUNTER — Ambulatory Visit: Payer: BC Managed Care – PPO | Admitting: Family Medicine

## 2019-09-08 ENCOUNTER — Encounter: Payer: Self-pay | Admitting: Family Medicine

## 2019-09-08 ENCOUNTER — Other Ambulatory Visit: Payer: Self-pay

## 2019-09-08 VITALS — BP 117/73 | HR 85 | Temp 98.8°F | Ht 63.0 in | Wt 99.0 lb

## 2019-09-08 DIAGNOSIS — G43119 Migraine with aura, intractable, without status migrainosus: Secondary | ICD-10-CM | POA: Diagnosis not present

## 2019-09-08 DIAGNOSIS — F411 Generalized anxiety disorder: Secondary | ICD-10-CM

## 2019-09-08 DIAGNOSIS — R110 Nausea: Secondary | ICD-10-CM

## 2019-09-08 DIAGNOSIS — G43811 Other migraine, intractable, with status migrainosus: Secondary | ICD-10-CM

## 2019-09-08 DIAGNOSIS — G43709 Chronic migraine without aura, not intractable, without status migrainosus: Secondary | ICD-10-CM | POA: Diagnosis not present

## 2019-09-08 MED ORDER — ONDANSETRON 4 MG PO TBDP: ORAL_TABLET | ORAL | 0 refills | Status: AC

## 2019-09-08 MED ORDER — PROPRANOLOL HCL 20 MG PO TABS: ORAL_TABLET | ORAL | 0 refills | Status: DC

## 2019-09-08 MED ORDER — PROPRANOLOL HCL ER 60 MG PO CP24: 60.0000 mg | ORAL_CAPSULE | Freq: Every day | ORAL | 3 refills | Status: DC

## 2019-09-08 MED ORDER — RIZATRIPTAN BENZOATE 10 MG PO TABS: 10.0000 mg | ORAL_TABLET | ORAL | 3 refills | Status: DC | PRN

## 2019-09-08 NOTE — Progress Notes (Signed)
Established Patient Office Visit  Subjective:  Patient ID: Hayley Jensen, female    DOB: 1996/01/19  Age: 23 y.o. MRN: [id number]  CC:  Chief Complaint  Patient presents with  . Emotional support note    wants a note for landlord to have pets in home   . Headache    wants a refill on meds for HA    HPI Hayley Jensen presents for    Patient reports that she has been interested in getting a support animal She was advised to get a support animal    Headaches She gets nausea with her headaches with talking, hearing sounds, it just gets so much worse She does not feel like the maxalt is helps to alleviate the symptoms for an hour She still wakes up with nausea and headaches She reports that she just feels overwhelmed by online school She states that she feels like she is failing She reports that she has difficulty keeping to a difficult schedule.  Patient reports that she uses condoms Patient's last menstrual period was 08/26/2019. She stopped taking contraception at age 68     Past Medical History:  Diagnosis Date  . Anxiety   . Asthma   . Depression   . Ovarian cyst     Past Surgical History:  Procedure Laterality Date  . APPENDECTOMY      No family history on file.  Social History   Socioeconomic History  . Marital status: Single    Spouse name: Not on file  . Number of children: Not on file  . Years of education: Not on file  . Highest education level: Not on file  Occupational History  . Not on file  Social Needs  . Financial resource strain: Not on file  . Food insecurity    Worry: Not on file    Inability: Not on file  . Transportation needs    Medical: Not on file    Non-medical: Not on file  Tobacco Use  . Smoking status: Never Smoker  . Smokeless tobacco: Never Used  Substance and Sexual Activity  . Alcohol use: Yes    Alcohol/week: 1.0 standard drinks    Types: 1 Glasses of wine per week  . Drug use: No  . Sexual activity: Yes    Birth control/protection: Pill  Lifestyle  . Physical activity    Days per week: Not on file    Minutes per session: Not on file  . Stress: Not on file  Relationships  . Social Herbalist on phone: Not on file    Gets together: Not on file    Attends religious service: Not on file    Active member of club or organization: Not on file    Attends meetings of clubs or organizations: Not on file    Relationship status: Not on file  . Intimate partner violence    Fear of current or ex partner: Not on file    Emotionally abused: Not on file    Physically abused: Not on file    Forced sexual activity: Not on file  Other Topics Concern  . Not on file  Social History Narrative  . Not on file    Outpatient Medications Prior to Visit  Medication Sig Dispense Refill  . albuterol (VENTOLIN HFA) 108 (90 Base) MCG/ACT inhaler Inhale 1 puff into the lungs every 6 (six) hours as needed for wheezing or shortness of breath. 18 g 3  . amphetamine-dextroamphetamine (  ADDERALL) 20 MG tablet Take 1 tablet (20 mg total) by mouth 2 (two) times daily. 60 tablet 0  . benzonatate (TESSALON) 100 MG capsule Take 1-2 capsules (100-200 mg total) by mouth 3 (three) times daily as needed for cough. 40 capsule 0  . busPIRone (BUSPAR) 7.5 MG tablet Take 7.5 mg by mouth daily as needed. For anxiety    . hydrOXYzine (ATARAX/VISTARIL) 25 MG tablet Take 1 or 2 pills 3 times daily as needed for itching. 40 tablet 0  . naproxen (NAPROSYN) 500 MG tablet Take by mouth.    . ondansetron (ZOFRAN ODT) 4 MG disintegrating tablet Take 1 every 6 hours as needed for nausea 20 tablet 0  . valACYclovir (VALTREX) 500 MG tablet Take 1 tablet (500 mg total) by mouth 2 (two) times daily. Take for three days.  Repeat course at first sign of a new outbreak. 30 tablet 3  . fluconazole (DIFLUCAN) 200 MG tablet Take one dose by mouth, wait 72 hours, and then take second dose by mouth 2 tablet 0   No facility-administered  medications prior to visit.     Allergies  Allergen Reactions  . Wellbutrin [Bupropion] Nausea And Vomiting    ROS Review of Systems    Objective:    Physical Exam  BP 117/73   Pulse 85   Temp 98.8 F (37.1 C) (Oral)   Ht _0  (1.6 m)   Wt 99 lb (44.9 kg)   LMP 08/26/2019   SpO2 99%   BMI 17.54 kg/m  Wt Readings from Last 3 Encounters:  09/08/19 99 lb (44.9 kg)  06/22/19 96 lb 3.2 oz (43.6 kg)  05/31/19 96 lb (43.5 kg)     Health Maintenance Due  Topic Date Due  . TETANUS/TDAP  02/21/2013  . PAP-Cervical Cytology Screening  02/22/2015  . PAP SMEAR-Modifier  02/22/2015    There are no preventive care reminders to display for this patient.  No results found for: TSH No results found for: WBC, HGB, HCT, MCV, PLT No results found for: NA, K, CHLORIDE, CO2, GLUCOSE, BUN, CREATININE, BILITOT, ALKPHOS, AST, ALT, PROT, ALBUMIN, CALCIUM, ANIONGAP, EGFR, GFR No results found for: CHOL No results found for: HDL No results found for: LDLCALC No results found for: TRIG No results found for: CHOLHDL No results found for: HGBA1C    Assessment & Plan:   Problem List Items Addressed This Visit    None      No orders of the defined types were placed in this encounter.   Follow-up: No follow-ups on file.    Forrest Moron, MD

## 2019-09-08 NOTE — Patient Instructions (Addendum)
Start propranolol 20mg  once a day for 3 days  Then propranolol 20mg  twice a day for 3 days Then propranolol 20mg  three times a day for 3 days   Try gin-gin chews found at [employer] and on [place] for nausea     If you have lab work done today you will be contacted with your lab results within the next 2 weeks.  If you have not heard from [place] then please contact us. The fastest way to get your results is to register for My Chart.   IF you received an x-ray today, you will receive an invoice from [facility]. Please contact [facility] at [phone] with questions or concerns regarding your invoice.   IF you received labwork today, you will receive an invoice from [facility]. Please contact [facility] at [phone] with questions or concerns regarding your invoice.   Our billing staff will not be able to assist you with questions regarding bills from these companies.  You will be contacted with the lab results as soon as they are available. The fastest way to get your results is to activate your My Chart account. Instructions are located on the last page of this paperwork. If you have not heard from [place] regarding the results in 2 weeks, please contact this office.

## 2019-09-13 ENCOUNTER — Ambulatory Visit: Payer: BC Managed Care – PPO | Admitting: Family Medicine

## 2019-09-20 ENCOUNTER — Ambulatory Visit: Payer: BC Managed Care – PPO | Admitting: Family Medicine

## 2019-10-08 DIAGNOSIS — F901 Attention-deficit hyperactivity disorder, predominantly hyperactive type: Secondary | ICD-10-CM | POA: Diagnosis not present

## 2019-10-08 DIAGNOSIS — F422 Mixed obsessional thoughts and acts: Secondary | ICD-10-CM

## 2019-10-08 DIAGNOSIS — F4321 Adjustment disorder with depressed mood: Secondary | ICD-10-CM | POA: Diagnosis not present

## 2019-11-16 ENCOUNTER — Encounter: Payer: Self-pay | Admitting: Obstetrics and Gynecology

## 2019-12-08 ENCOUNTER — Telehealth: Payer: Self-pay | Admitting: Family Medicine

## 2019-12-08 ENCOUNTER — Other Ambulatory Visit: Payer: Self-pay

## 2019-12-08 ENCOUNTER — Encounter: Payer: Self-pay | Admitting: Family Medicine

## 2019-12-08 ENCOUNTER — Ambulatory Visit (INDEPENDENT_AMBULATORY_CARE_PROVIDER_SITE_OTHER): Payer: PRIVATE HEALTH INSURANCE | Admitting: Family Medicine

## 2019-12-08 VITALS — BP 108/70 | HR 74 | Temp 98.4°F | Ht 66.0 in | Wt 96.6 lb

## 2019-12-08 DIAGNOSIS — G43119 Migraine with aura, intractable, without status migrainosus: Secondary | ICD-10-CM

## 2019-12-08 DIAGNOSIS — F900 Attention-deficit hyperactivity disorder, predominantly inattentive type: Secondary | ICD-10-CM | POA: Diagnosis not present

## 2019-12-08 DIAGNOSIS — F902 Attention-deficit hyperactivity disorder, combined type: Secondary | ICD-10-CM

## 2019-12-08 DIAGNOSIS — R5383 Other fatigue: Secondary | ICD-10-CM | POA: Diagnosis not present

## 2019-12-08 DIAGNOSIS — G43811 Other migraine, intractable, with status migrainosus: Secondary | ICD-10-CM

## 2019-12-08 MED ORDER — AMPHETAMINE-DEXTROAMPHETAMINE 20 MG PO TABS: 20.0000 mg | ORAL_TABLET | Freq: Two times a day (BID) | ORAL | 0 refills | Status: DC

## 2019-12-08 MED ORDER — RIZATRIPTAN BENZOATE 10 MG PO TABS: 10.0000 mg | ORAL_TABLET | ORAL | 3 refills | Status: DC | PRN

## 2019-12-08 NOTE — Telephone Encounter (Signed)
12/08/2019 - PATIENT SAW DR. Creta Levin IN THE OFFICE ON Wednesday (12/08/2019) FOR HER ANXIETY AND HEADACHES. DR. Creta Levin HAS REQUESTED SHE RETURN IN 3 MONTHS (MAY 2021) FOR A FOLLOW-UP VISIT ON HER ADD. I DID NOT SEE HER COME TO CHECK-OUT. I TRIED TO CALL AND SCHEDULE BUT HAD TO LEAVE HER A MESSAGE ON HER VOICE MAIL TO RETURN MY CALL. MBC

## 2019-12-08 NOTE — Patient Instructions (Addendum)
1.  Call [facility] to follow up 2. Call [facility] for evaluation and treatment migraines 3. Your medication Adderall and Maxalt are at the pharmacy 4. Pick up a omega - 3 supplement 5.  Pick up vitamin D 1000 units and start taking daily   If you have lab work done today you will be contacted with your lab results within the next 2 weeks.  If you have not heard from [place] then please contact us. The fastest way to get your results is to register for My Chart.   IF you received an x-ray today, you will receive an invoice from [facility]. Please contact [facility] at [phone] with questions or concerns regarding your invoice.   IF you received labwork today, you will receive an invoice from [facility]. Please contact [facility] at [phone] with questions or concerns regarding your invoice.   Our billing staff will not be able to assist you with questions regarding bills from these companies.  You will be contacted with the lab results as soon as they are available. The fastest way to get your results is to activate your My Chart account. Instructions are located on the last page of this paperwork. If you have not heard from [place] regarding the results in 2 weeks, please contact this office.

## 2019-12-08 NOTE — Progress Notes (Signed)
Established Patient Office Visit  Subjective:  Patient ID: Hayley Jensen, female    DOB: Nov 09, 1995  Age: 24 y.o. MRN: [id number]  CC:  Chief Complaint  Patient presents with  . ADHD F/u  . Anxiety    HPI Hayley Jensen presents for   ADD Pt has a diagnosis of ADHD - predominantly inattentive type Pt is taking Adderall 20mg  as needed and has good results Pt does not use other stimulants such as energy drinks or caffeine Pt is able to sleep at night and denies unintentional weight loss Pt  denies mood swings Pt skips doses when not working or on weekends  Migraine She cannot tolerate the propranolol for migraines She states that she was not able to stay awake due to fatigue and dizziness She states that she took it for 2-3 weeks  She is not willing to get on oral contraception at this time since in the past she lactated She states that in a typical week she gets 2 -3 headaches The severe migraines are once a month lasting 10-14 days Wt Readings from Last 3 Encounters:  12/08/19 96 lb 9.6 oz (43.8 kg)  09/08/19 99 lb (44.9 kg)  06/22/19 96 lb 3.2 oz (43.6 kg)      Past Medical History:  Diagnosis Date  . Anxiety   . Asthma   . Depression   . Ovarian cyst     Past Surgical History:  Procedure Laterality Date  . APPENDECTOMY      No family history on file.  Social History   Socioeconomic History  . Marital status: Single    Spouse name: Not on file  . Number of children: Not on file  . Years of education: Not on file  . Highest education level: Not on file  Occupational History  . Not on file  Tobacco Use  . Smoking status: Never Smoker  . Smokeless tobacco: Never Used  Substance and Sexual Activity  . Alcohol use: Yes    Alcohol/week: 1.0 standard drinks    Types: 1 Glasses of wine per week  . Drug use: No  . Sexual activity: Yes    Birth control/protection: Pill  Other Topics Concern  . Not on file  Social History Narrative  . Not on file    Social Determinants of Health   Financial Resource Strain:   . Difficulty of Paying Living Expenses: Not on file  Food Insecurity:   . Worried About 06/24/19 in the Last Year: Not on file  . Ran Out of Food in the Last Year: Not on file  Transportation Needs:   . Lack of Transportation (Medical): Not on file  . Lack of Transportation (Non-Medical): Not on file  Physical Activity:   . Days of Exercise per Week: Not on file  . Minutes of Exercise per Session: Not on file  Stress:   . Feeling of Stress : Not on file  Social Connections:   . Frequency of Communication with Friends and Family: Not on file  . Frequency of Social Gatherings with Friends and Family: Not on file  . Attends Religious Services: Not on file  . Active Member of Clubs or Organizations: Not on file  . Attends Programme researcher, broadcasting/film/video Meetings: Not on file  . Marital Status: Not on file  Intimate Partner Violence:   . Fear of Current or Ex-Partner: Not on file  . Emotionally Abused: Not on file  . Physically Abused: Not on file  .  Sexually Abused: Not on file    Outpatient Medications Prior to Visit  Medication Sig Dispense Refill  . albuterol (VENTOLIN HFA) 108 (90 Base) MCG/ACT inhaler Inhale 1 puff into the lungs every 6 (six) hours as needed for wheezing or shortness of breath. 18 g 3  . busPIRone (BUSPAR) 7.5 MG tablet Take 7.5 mg by mouth daily as needed. For anxiety    . hydrOXYzine (ATARAX/VISTARIL) 25 MG tablet Take 1 or 2 pills 3 times daily as needed for itching. 40 tablet 0  . naproxen (NAPROSYN) 500 MG tablet Take by mouth.    . ondansetron (ZOFRAN ODT) 4 MG disintegrating tablet Take 1 every 6 hours as needed for nausea 20 tablet 0  . valACYclovir (VALTREX) 500 MG tablet Take 1 tablet (500 mg total) by mouth 2 (two) times daily. Take for three days.  Repeat course at first sign of a new outbreak. 30 tablet 3  . amphetamine-dextroamphetamine (ADDERALL) 20 MG tablet Take 1 tablet (20 mg  total) by mouth 2 (two) times daily. 60 tablet 0  . rizatriptan (MAXALT) 10 MG tablet Take 1 tablet (10 mg total) by mouth as needed for migraine. May repeat in 2 hours if needed 10 tablet 3  . benzonatate (TESSALON) 100 MG capsule Take 1-2 capsules (100-200 mg total) by mouth 3 (three) times daily as needed for cough. 40 capsule 0  . propranolol (INDERAL) 20 MG tablet Take one tablet by mouth for 3 days.  Then one tablet twice a day for 3 days then one tablet three times a day for 3 day. (Patient not taking: Reported on 12/08/2019) 18 tablet 0  . propranolol ER (INDERAL LA) 60 MG 24 hr capsule Take 1 capsule (60 mg total) by mouth daily. (Patient not taking: Reported on 12/08/2019) 30 capsule 3   No facility-administered medications prior to visit.    Allergies  Allergen Reactions  . Wellbutrin [Bupropion] Nausea And Vomiting    ROS Review of Systems See hpi Review of Systems  Constitutional: Negative for activity change, appetite change, chills and fever.  HENT: Negative for congestion, nosebleeds, trouble swallowing and voice change.   Respiratory: Negative for cough, shortness of breath and wheezing.   Gastrointestinal: Negative for diarrhea, nausea and vomiting.  Genitourinary: Negative for difficulty urinating, dysuria, flank pain and hematuria.  Musculoskeletal: Negative for back pain, joint swelling and neck pain.  Neurological: see hpi See HPI. All other review of systems negative.     Objective:    Physical Exam  BP 108/70   Pulse 74   Temp 98.4 F (36.9 C) (Temporal)   Ht 5\' 6"  (1.676 m)   Wt 96 lb 9.6 oz (43.8 kg)   LMP 11/07/2019   SpO2 100%   BMI 15.59 kg/m  Wt Readings from Last 3 Encounters:  12/08/19 96 lb 9.6 oz (43.8 kg)  09/08/19 99 lb (44.9 kg)  06/22/19 96 lb 3.2 oz (43.6 kg)   Physical Exam  Constitutional: Oriented to person, place, and time. Appears well-developed and well-nourished.  HENT:  Head: Normocephalic and atraumatic.  Eyes:  Conjunctivae and EOM are normal.  Cardiovascular: Normal rate, regular rhythm, normal heart sounds and intact distal pulses.  No murmur heard. Pulmonary/Chest: Effort normal and breath sounds normal. No stridor. No respiratory distress. Has no wheezes.  Neurological: Is alert and oriented to person, place, and time.  Skin: Skin is warm. Capillary refill takes less than 2 seconds.  Psychiatric: Has a normal mood and affect. Behavior is normal.  Judgment and thought content normal.    Health Maintenance Due  Topic Date Due  . TETANUS/TDAP  02/21/2013  . PAP-Cervical Cytology Screening  02/22/2015  . PAP SMEAR-Modifier  02/22/2015    There are no preventive care reminders to display for this patient.     Assessment & Plan:   Problem List Items Addressed This Visit      Cardiovascular and Mediastinum   Migraine with aura, intractable   Relevant Medications   rizatriptan (MAXALT) 10 MG tablet   Other Relevant Orders   Ambulatory referral to Neurology    Other Visit Diagnoses    Attention deficit hyperactivity disorder (ADHD), predominantly inattentive type    -  Primary   Other fatigue    -  Discussed screening for deficiency    Relevant Orders   VITAMIN D 25 Hydroxy (Vit-D Deficiency, Fractures)   CBC   TSH   Attention deficit hyperactivity disorder (ADHD), combined type    -  Reviewed [place]   Relevant Medications   amphetamine-dextroamphetamine (ADDERALL) 20 MG tablet   Other migraine with status migrainosus, intractable       Relevant Medications   rizatriptan (MAXALT) 10 MG tablet    -    Meds ordered this encounter  Medications  . amphetamine-dextroamphetamine (ADDERALL) 20 MG tablet    Sig: Take 1 tablet (20 mg total) by mouth 2 (two) times daily.    Dispense:  60 tablet    Refill:  0  . rizatriptan (MAXALT) 10 MG tablet    Sig: Take 1 tablet (10 mg total) by mouth as needed for migraine. May repeat in 2 hours if needed    Dispense:  10 tablet    Refill:  3     Follow-up: Return in about 3 months (around 03/06/2020) for ADD.    Forrest Moron, MD

## 2019-12-09 LAB — CBC
Hematocrit: 39.4 % (ref 34.0–46.6)
Hemoglobin: 13.6 g/dL (ref 11.1–15.9)
MCH: 30.7 pg (ref 26.6–33.0)
MCHC: 34.5 g/dL (ref 31.5–35.7)
MCV: 89 fL (ref 79–97)
Platelets: 226 10*3/uL (ref 150–450)
RBC: 4.43 x10E6/uL (ref 3.77–5.28)
RDW: 12 % (ref 11.7–15.4)
WBC: 5.5 10*3/uL (ref 3.4–10.8)

## 2019-12-09 LAB — VITAMIN D 25 HYDROXY (VIT D DEFICIENCY, FRACTURES): Vit D, 25-Hydroxy: 29.3 ng/mL — ABNORMAL LOW (ref 30.0–100.0)

## 2019-12-09 LAB — TSH: TSH: 1.44 u[IU]/mL (ref 0.450–4.500)

## 2019-12-13 ENCOUNTER — Other Ambulatory Visit: Payer: Self-pay

## 2019-12-14 ENCOUNTER — Ambulatory Visit (INDEPENDENT_AMBULATORY_CARE_PROVIDER_SITE_OTHER): Payer: PRIVATE HEALTH INSURANCE | Admitting: Obstetrics and Gynecology

## 2019-12-14 ENCOUNTER — Other Ambulatory Visit (HOSPITAL_COMMUNITY)
Admission: RE | Admit: 2019-12-14 | Discharge: 2019-12-14 | Disposition: A | Discharge status: Home / Self Care | Payer: PRIVATE HEALTH INSURANCE | Source: Ambulatory Visit | Attending: Obstetrics and Gynecology | Admitting: Obstetrics and Gynecology

## 2019-12-14 ENCOUNTER — Encounter: Payer: Self-pay | Admitting: Obstetrics and Gynecology

## 2019-12-14 VITALS — BP 96/60 | HR 76 | Temp 97.1°F | Resp 14 | Ht 62.5 in | Wt 98.0 lb

## 2019-12-14 DIAGNOSIS — N760 Acute vaginitis: Secondary | ICD-10-CM | POA: Diagnosis not present

## 2019-12-14 DIAGNOSIS — Z113 Encounter for screening for infections with a predominantly sexual mode of transmission: Secondary | ICD-10-CM

## 2019-12-14 DIAGNOSIS — Z23 Encounter for immunization: Secondary | ICD-10-CM

## 2019-12-14 DIAGNOSIS — Z01419 Encounter for gynecological examination (general) (routine) without abnormal findings: Secondary | ICD-10-CM

## 2019-12-14 NOTE — Progress Notes (Signed)
24 y.o. G44P0000 Single Caucasian female here for annual exam.    Patient states she has a terrible yeast infection. States vaginal odor 4 days ago. She states she has a yeast infection prior to her menses every month.   Hx painful menses since 2013 or 2014. Was dx with a cyst on her ovary.  She started on birth control then and this worsened her migraines.  She had pelvic pain even taking continuous contraception.   She had laparoscopy with appendectomy.  States she had "fallopian tissue all over everything." Felt better after surgery.  She stopped birth control in 2018 and made lifestyle changes.  Can still have pain and feel like she is going to pass out, but states she can tolerate it.   Her flow requires changes of tampon every 1 - 2 hours.   When she stopped her birth control in 2018, she developed bilateral nipple discharge.  The discharge stopped.   Had her TSH, iron and CBC checked last week, and it was normal.  Thinking about pregnancy in the future.  Last STD screening in 2016.   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  She is engaged to be married.  She does not have a relationship with her mother.  PCP: Collie Siad, MD  Patient's last menstrual period was 11/11/2019 (approximate).     Period Cycle (Days): 30 Period Duration (Days): 4-5 days Period Pattern: Regular Menstrual Flow: Heavy(heavy first 2-3 days then tapers) Menstrual Control: Tampon, Maxi pad Menstrual Control Change Freq (Hours): every hour on heaviest day Dysmenorrhea: (!) Severe Dysmenorrhea Symptoms: Cramping, Nausea, Headache(Hx of migraines)     Sexually active: Yes.    The current method of family planning is condoms most of the time.    Exercising: No.  The patient does not participate in regular exercise at present.Walks to class and walks dog Smoker:  no  Health Maintenance: Pap: 2016 normal per patient History of abnormal Pap:  no MMG:  n/a Colonoscopy:  n/a BMD:   n/a  Result  n/a TDaP: 10  years Gardasil:   Yes, completed.   HIV: no Hep C:no Screening Labs:  PCP.  Flu vaccine:  Completed.   reports that she has never smoked. She has never used smokeless tobacco. She reports current alcohol use of about 1.0 standard drinks of alcohol per week. She reports that she does not use drugs.  Past Medical History:  Diagnosis Date  . ADHD   . Anxiety   . Asthma   . Bilateral ovarian cysts   . Depression   . Dysmenorrhea   . Endometriosis   . HSV-1 infection   . Migraines    with aura  . Ovarian cyst     Past Surgical History:  Procedure Laterality Date  . APPENDECTOMY    . PELVIC LAPAROSCOPY      Current Outpatient Medications  Medication Sig Dispense Refill  . albuterol (VENTOLIN HFA) 108 (90 Base) MCG/ACT inhaler Inhale 1 puff into the lungs every 6 (six) hours as needed for wheezing or shortness of breath. 18 g 3  . amphetamine-dextroamphetamine (ADDERALL) 20 MG tablet Take 1 tablet (20 mg total) by mouth 2 (two) times daily. 60 tablet 0  . hydrOXYzine (ATARAX/VISTARIL) 25 MG tablet Take 1 or 2 pills 3 times daily as needed for itching. 40 tablet 0  . naproxen (NAPROSYN) 500 MG tablet Take by mouth.    . ondansetron (ZOFRAN ODT) 4 MG disintegrating tablet Take 1 every 6 hours as needed for  nausea 20 tablet 0  . rizatriptan (MAXALT) 10 MG tablet Take 1 tablet (10 mg total) by mouth as needed for migraine. May repeat in 2 hours if needed 10 tablet 3  . valACYclovir (VALTREX) 500 MG tablet Take 1 tablet (500 mg total) by mouth 2 (two) times daily. Take for three days.  Repeat course at first sign of a new outbreak. 30 tablet 3   No current facility-administered medications for this visit.    Family History  Problem Relation Age of Onset  . Hypertension Father   . Breast cancer Maternal Grandmother   . Hypertension Maternal Grandfather   . Ovarian cancer Paternal Grandmother 22       Dec from ovarian ca  . Cancer Paternal Grandmother        ?endometrial ca  .  Diabetes Paternal Grandmother   . Hypertension Paternal Grandfather     Review of Systems  All other systems reviewed and are negative.   Exam:   BP 96/60   Pulse 76   Temp (!) 97.1 F (36.2 C) (Temporal)   Resp 14   Ht 5' 2.5" (1.588 m)   Wt 98 lb (44.5 kg)   LMP 11/11/2019 (Approximate)   BMI 17.64 kg/m     General appearance: alert, cooperative and appears stated age Head: normocephalic, without obvious abnormality, atraumatic Neck: no adenopathy, supple, symmetrical, trachea midline and thyroid normal to inspection and palpation Lungs: clear to auscultation bilaterally Breasts: normal appearance, no masses or tenderness, No nipple retraction or dimpling, No nipple discharge or bleeding, No axillary adenopathy Heart: regular rate and rhythm Abdomen: soft, non-tender; no masses, no organomegaly Extremities: extremities normal, atraumatic, no cyanosis or edema Skin: skin color, texture, turgor normal. No rashes or lesions Lymph nodes: cervical, supraclavicular, and axillary nodes normal. Neurologic: grossly normal  Pelvic: External genitalia:  no lesions              No abnormal inguinal nodes palpated.              Urethra:  normal appearing urethra with no masses, tenderness or lesions              Bartholins and Skenes: normal                 Vagina: normal appearing vagina with normal color and large amount of clumpy white discharge, odor noted.  no lesions              Cervix: no lesions              Pap taken: Yes.   Bimanual Exam:  Uterus:  normal size, contour, position, consistency, mobility, non-tender              Adnexa: no mass, fullness, tenderness          Chaperone was present for exam.  Assessment:   Well woman visit with normal exam. Status post laparoscopy for endometriosis. Dysmenorrhea and menorrhagia. 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 STD screening.  Vaginitis.   Plan: Mammogram screening discussed. Self breast  awareness reviewed. Pap and HR HPV as above. Guidelines for Calcium, Vitamin D, regular exercise program including cardiovascular and weight bearing exercise. Declines Valtrex.  She likes her current regimen for both oral and genital outbreaks. Start PNV.  STD screening.  Vaginitis screening.  Check CMP and lipid profile. TDap. Follow up annually and prn.   After visit summary provided.

## 2019-12-14 NOTE — Patient Instructions (Signed)

## 2019-12-15 LAB — HEP, RPR, HIV PANEL
HIV Screen 4th Generation wRfx: NONREACTIVE
Hepatitis B Surface Ag: NEGATIVE
RPR Ser Ql: NONREACTIVE

## 2019-12-15 LAB — COMPREHENSIVE METABOLIC PANEL
ALT: 7 IU/L (ref 0–32)
AST: 16 IU/L (ref 0–40)
Albumin/Globulin Ratio: 1.7 (ref 1.2–2.2)
Albumin: 4.8 g/dL (ref 3.9–5.0)
Alkaline Phosphatase: 51 IU/L (ref 39–117)
BUN/Creatinine Ratio: 17 (ref 9–23)
BUN: 10 mg/dL (ref 6–20)
Bilirubin Total: 0.9 mg/dL (ref 0.0–1.2)
CO2: 23 mmol/L (ref 20–29)
Calcium: 9.6 mg/dL (ref 8.7–10.2)
Chloride: 102 mmol/L (ref 96–106)
Creatinine, Ser: 0.6 mg/dL (ref 0.57–1.00)
GFR calc Af Amer: 147 mL/min/{1.73_m2} (ref >59)
GFR calc non Af Amer: 127 mL/min/{1.73_m2} (ref >59)
Globulin, Total: 2.8 g/dL (ref 1.5–4.5)
Glucose: 77 mg/dL (ref 65–99)
Potassium: 4.3 mmol/L (ref 3.5–5.2)
Sodium: 140 mmol/L (ref 134–144)
Total Protein: 7.6 g/dL (ref 6.0–8.5)

## 2019-12-15 LAB — LIPID PANEL
Chol/HDL Ratio: 2.1 ratio (ref 0.0–4.4)
Cholesterol, Total: 151 mg/dL (ref 100–199)
HDL: 71 mg/dL
LDL Chol Calc (NIH): 69 mg/dL (ref 0–99)
Triglycerides: 52 mg/dL (ref 0–149)
VLDL Cholesterol Cal: 11 mg/dL (ref 5–40)

## 2019-12-15 LAB — HEPATITIS C ANTIBODY: Hep C Virus Ab: <0.1 s/co ratio (ref 0.0–0.9)

## 2019-12-16 LAB — NUSWAB VAGINITIS PLUS (VG+)
Candida albicans, NAA: NEGATIVE
Candida glabrata, NAA: NEGATIVE
Chlamydia trachomatis, NAA: NEGATIVE
Neisseria gonorrhoeae, NAA: NEGATIVE
Trich vag by NAA: NEGATIVE

## 2019-12-16 LAB — CYTOLOGY - PAP
Comment: NEGATIVE
Diagnosis: NEGATIVE
High risk HPV: NEGATIVE

## 2019-12-27 ENCOUNTER — Encounter: Payer: Self-pay | Admitting: Obstetrics and Gynecology

## 2020-01-03 ENCOUNTER — Encounter: Payer: Self-pay | Admitting: Physician Assistant

## 2020-01-03 ENCOUNTER — Ambulatory Visit (INDEPENDENT_AMBULATORY_CARE_PROVIDER_SITE_OTHER): Payer: PRIVATE HEALTH INSURANCE | Admitting: Physician Assistant

## 2020-01-03 ENCOUNTER — Other Ambulatory Visit: Payer: Self-pay

## 2020-01-03 VITALS — BP 90/56 | HR 74 | Temp 98.2°F | Ht 63.0 in | Wt 97.0 lb

## 2020-01-03 DIAGNOSIS — F900 Attention-deficit hyperactivity disorder, predominantly inattentive type: Secondary | ICD-10-CM | POA: Diagnosis not present

## 2020-01-03 DIAGNOSIS — G43119 Migraine with aura, intractable, without status migrainosus: Secondary | ICD-10-CM

## 2020-01-03 MED ORDER — SUMATRIPTAN SUCCINATE 25 MG PO TABS: 25.0000 mg | ORAL_TABLET | ORAL | 0 refills | Status: DC | PRN

## 2020-01-03 NOTE — Patient Instructions (Signed)
It was great to see you!  Migraine Recommendations: 1.  Preventative medication per Jaffe. 2.  Take Imitrex at earliest onset of headache.  May repeat dose once in 2 hours if needed.  Do not exceed two 2 doses in 24 hours. 3.  Limit use of pain relievers to no more than 2 days out of the week.  These medications include acetaminophen, ibuprofen, triptans and narcotics.  This will help reduce risk of rebound headaches. 4.  Be aware of common food triggers such as processed sweets, processed foods with nitrites (such as deli meat, hot dogs, sausages), foods with MSG, alcohol (such as wine), chocolate, certain cheeses, certain fruits (dried fruits, bananas, pineapple), vinegar, diet soda. 4.  Avoid caffeine 5.  Routine exercise 6.  Proper sleep hygiene 7.  Stay adequately hydrated with water 8.  Keep a headache diary. 9.  Maintain proper stress management. 10.  Do not skip meals. 11.  Consider supplements:  Magnesium citrate 400mg  to 600mg  daily, riboflavin 400mg , Coenzyme Q 10 100mg  three times daily

## 2020-01-03 NOTE — Progress Notes (Signed)
Hayley Jensen is a 24 y.o. female here to Establish care.  I acted as a Neurosurgeon for [employer], PA-C Corky Mull, LPN  History of Present Illness:   Chief Complaint  Patient presents with  . Establish Care    Migraines -- long history of this since she was 74-39 years old. Has been on OCPs as recently as a few years ago, was on for 10-15 years. Does get auras.   Preventative: 60 mg propranolol daily -- took for two weeks but made her very sleepy, in November 2020  Abortives: Butalbital, Maxalt, Toradol/Phenergan. Would like to try Imitrex.   ADHD -- currently on Adderall 20 mg BID prn. She most often feels like she needs to take this when she has need for focus. Has had accommodations for testing in the past and would like a letter for this. She was seen by Dr. 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  Health Maintenance: Immunizations -- UTD Colonoscopy -- N/A Mammogram -- N/A PAP -- UTD, due 11/2022 Bone Density -- N/A Weight -- Weight: 97 lb (44 kg)   Depression screen PHQ 2/9 12/08/2019  Decreased Interest 0  Down, Depressed, Hopeless 0  PHQ - 2 Score 0    No flowsheet data found.   Other providers/specialists: Patient Care Team: Doristine Bosworth, MD as PCP - General (Internal Medicine)   Past Medical History:  Diagnosis Date  . ADHD   . Anxiety   . Asthma   . Bilateral ovarian cysts   . Depression   . Dysmenorrhea   . Endometriosis   . History of chicken pox   . History of shingles 2009  . HSV-1 infection   . Migraines    with aura  . Ovarian cyst      Social History   Socioeconomic History  . Marital status: Single    Spouse name: Not on file  . Number of children: Not on file  . Years of education: Not on file  . Highest education level: Not on file  Occupational History  . Not on file  Tobacco Use  . Smoking status:  Never Smoker  . Smokeless tobacco: Never Used  Substance and Sexual Activity  . Alcohol use: Yes    Alcohol/week: 1.0 standard drinks    Types: 1 Glasses of wine per week  . Drug use: No  . Sexual activity: Yes    Birth control/protection: Condom  Other Topics Concern  . Not on file  Social History Narrative  . Not on file   Social Determinants of Health   Financial Resource Strain:   . Difficulty of Paying Living Expenses: Not on file  Food Insecurity:   . Worried About Programme researcher, broadcasting/film/video in the Last Year: Not on file  . Ran Out of Food in the Last Year: Not on file  Transportation Needs:   . Lack of Transportation (Medical): Not on file  . Lack of Transportation (Non-Medical): Not on file  Physical Activity:   . Days of Exercise per Week: Not on file  . Minutes of Exercise per Session: Not on file  Stress:   . Feeling of Stress : Not on file  Social Connections:   . Frequency of Communication with Friends and Family: Not on file  . Frequency of Social Gatherings with Friends and Family: Not on file  . Attends Religious Services: Not on file  .  Active Member of Clubs or Organizations: Not on file  . Attends Banker Meetings: Not on file  . Marital Status: Not on file  Intimate Partner Violence:   . Fear of Current or Ex-Partner: Not on file  . Emotionally Abused: Not on file  . Physically Abused: Not on file  . Sexually Abused: Not on file    Past Surgical History:  Procedure Laterality Date  . APPENDECTOMY    . PELVIC LAPAROSCOPY  03/18/2012   laparoscopic exicision of peritoneal lesion (endosalpingosis), indicental appendectomy    Family History  Problem Relation Age of Onset  . Hypertension Father   . Breast cancer Maternal Grandmother   . Hypertension Maternal Grandfather   . Ovarian cancer Paternal Grandmother 53       Dec from ovarian ca  . Cancer Paternal Grandmother        uterine  . Diabetes Paternal Grandmother   . Hypertension  Paternal Grandfather   . Kidney cancer Paternal Grandfather     Allergies  Allergen Reactions  . Wellbutrin [Bupropion] Nausea And Vomiting     Current Medications:   Current Outpatient Medications:  .  albuterol (VENTOLIN HFA) 108 (90 Base) MCG/ACT inhaler, Inhale 1 puff into the lungs every 6 (six) hours as needed for wheezing or shortness of breath., Disp: 18 g, Rfl: 3 .  amphetamine-dextroamphetamine (ADDERALL) 20 MG tablet, Take 1 tablet (20 mg total) by mouth 2 (two) times daily., Disp: 60 tablet, Rfl: 0 .  hydrOXYzine (ATARAX/VISTARIL) 25 MG tablet, Take 1 or 2 pills 3 times daily as needed for itching., Disp: 40 tablet, Rfl: 0 .  naproxen (NAPROSYN) 500 MG tablet, Take by mouth as needed. , Disp: , Rfl:  .  ondansetron (ZOFRAN ODT) 4 MG disintegrating tablet, Take 1 every 6 hours as needed for nausea, Disp: 20 tablet, Rfl: 0 .  rizatriptan (MAXALT) 10 MG tablet, Take 1 tablet (10 mg total) by mouth as needed for migraine. May repeat in 2 hours if needed, Disp: 10 tablet, Rfl: 3 .  valACYclovir (VALTREX) 500 MG tablet, Take 1 tablet (500 mg total) by mouth 2 (two) times daily. Take for three days.  Repeat course at first sign of a new outbreak., Disp: 30 tablet, Rfl: 3 .  SUMAtriptan (IMITREX) 25 MG tablet, Take 1 tablet (25 mg total) by mouth every 2 (two) hours as needed for migraine. May repeat in 2 hours if headache persists or recurs., Disp: 10 tablet, Rfl: 0   Review of Systems:   ROS  Negative unless otherwise specified per HPI.   Vitals:   Vitals:   01/03/20 1419  BP: (!) 90/56  Pulse: 74  Temp: 98.2 F (36.8 C)  TempSrc: Temporal  SpO2: 96%  Weight: 97 lb (44 kg)  Height: 5\' 3"  (1.6 m)      Body mass index is 17.18 kg/m.  Physical Exam:   Physical Exam Vitals and nursing note reviewed.  Constitutional:      General: She is not in acute distress.    Appearance: She is well-developed. She is not ill-appearing or toxic-appearing.  Cardiovascular:      Rate and Rhythm: Normal rate and regular rhythm.     Pulses: Normal pulses.     Heart sounds: Normal heart sounds, S1 normal and S2 normal.     Comments: No LE edema Pulmonary:     Effort: Pulmonary effort is normal.     Breath sounds: Normal breath sounds.  Skin:  General: Skin is warm and dry.  Neurological:     General: No focal deficit present.     Mental Status: She is alert.     GCS: GCS eye subscore is 4. GCS verbal subscore is 5. GCS motor subscore is 6.     Cranial Nerves: Cranial nerves are intact.     Sensory: Sensation is intact.     Motor: Motor function is intact.     Coordination: Coordination is intact.     Gait: Gait is intact.  Psychiatric:        Speech: Speech normal.        Behavior: Behavior normal. Behavior is cooperative.      Assessment and Plan:   Kensey was seen today for establish care.  Diagnoses and all orders for this visit:  Intractable migraine with aura without status migrainosus Uncontrolled. Has appointment with [facility] later this month. Will trial Imitrex as abortive in the interim. Further recommendations per [facility]. No red flags on exam -- discussed staying away from OCPs with estrogen 2/2 auras.  Attention deficit hyperactivity disorder (ADHD), predominantly inattentive type Database reviewed, no red flags on exam. Note provided today in office -- please see chart for letter. Follow-up as needed for medication refills or further assistance.  Other orders -     SUMAtriptan (IMITREX) 25 MG tablet; Take 1 tablet (25 mg total) by mouth every 2 (two) hours as needed for migraine. May repeat in 2 hours if headache persists or recurs.  Reviewed expectations re: course of current medical issues. Discussed self-management of symptoms. Outlined signs and symptoms indicating need for more acute intervention. Patient verbalized understanding and all questions were answered. See orders for this visit as documented in the electronic medical  record. Patient received an After-Visit Summary.  CMA or LPN served as scribe during this visit. History, Physical, and Plan performed by medical provider. The above documentation has been reviewed and is accurate and complete.  Inda Coke, PA-C

## 2020-01-17 NOTE — Progress Notes (Signed)
Virtual Visit via Video Note The purpose of this virtual visit is to provide medical care while limiting exposure to the novel coronavirus.    Consent was obtained for video visit:  Yes.   Answered questions that patient had about telehealth interaction:  Yes.   I discussed the limitations, risks, security and privacy concerns of performing an evaluation and management service by telemedicine. I also discussed with the patient that there may be a patient responsible charge related to this service. The patient expressed understanding and agreed to proceed.  Pt location: Home Physician Location: office Name of referring provider:  Doristine Bosworth, MD I connected with Envy N Portugal at patients initiation/request on 01/19/2020 at  7:50 AM EDT by video enabled telemedicine application and verified that I am speaking with the correct person using two identifiers. Pt MRN:  [id number] Pt DOB:  04-Aug-1996 Video Participants:  Noralee N Buckholtz;   History of Present Illness:  Hayley Jensen is a 24 year old female with ADHD and anxiety and history of kidney stones twice who presents for migraines.  History supplemented by referring provider note.  She started having migraines at age 56 to 1.  She had a visual aura with spinning or horizontal waves of rainbow colors in both eyes that impede vision, followed by nausea and vomiting.  They have progressed over the years and since around 24 years old they have been lasting several days at a time.  She still sometimes gets the visual aura.  Severe bifrontal pressure pain.  Sometimes right eye is pain worse with eye movement.  Neck and body stiffness.  Nose burns and turns red.  Ears ring and pop.  Severe nausea.  Sometimes vomiting.  Some photophobia, phonophobia, osmophobia, sometimes altered sense of smell.  No numbness or weakness.  Triggers include change in weather but does not appear to be associated with food or menstrual cycle.  No real relieving  factors.  They last 10 to 14 days.  They occur once a month.    Rescue therapy:  Rizatriptan, Zofran Analgesic frequency:  Rizatriptan 5 days in a month; naproxen 5 days in a month. Current NSAIDS:  Naproxen 500mg  (endometriosis) Current analgesics:  none Current triptans:  Rizatriptan 10mg  (provides 30 minutes relief and headache return); sumatriptan 25mg  (recently prescribed, no change compared to rizatriptan) Current ergotamine:  none Current anti-emetic:  Zofran ODT 8mg  Current muscle relaxants:  none Current anti-anxiolytic:  none Current sleep aide:  none Current Antihypertensive medications:  none Current Antidepressant medications:  none Current Anticonvulsant medications:  none Current anti-CGRP:  none Current Vitamins/Herbal/Supplements:  none Current Antihistamines/Decongestants:  none Other therapy:  Ginger candy for nausea Hormone/birth control:  none Other medications:  Adderall PRN  Past NSAIDS:  ibuprofen Past analgesics:  Fioricet; Tylenol; Excedrin Past abortive triptans:  none Past abortive ergotamine:  none Past muscle relaxants:  none Past anti-emetic:  none Past antihypertensive medications:  Propranolol (dizziness, fatigue) Past antidepressant medications:  Citalopram Past anticonvulsant medications:  none Past anti-CGRP:  none Past vitamins/Herbal/Supplements:  none Past antihistamines/decongestants:  none Other past therapies:  Sleep; peppermint; some cola  Caffeine:  Tea.  Rarely coffee.  No energy drinks.  Cola only for headache. Diet:  64 oz water daily.  Skips meals.  But grazes during day.   Exercise:  walks Depression:  no; Anxiety:  yes.  Currently in law school. Other pain:  endometriosis Sleep hygiene:  7 to 9 hours sleep a night. Family history of headache:  Mother (migraines); maternal grandfather (migraines with seizures)  11/2019 LABS: CBC with WBC 5.5, HGB 13.6, HCT 39.4, PLT 226; CMP with Na 140, K 4.3, Cl 102, CO2 23, glucose 77,  BUN 10, Cr 0.60, t bili 0.9, ALP 51, AST 16, ALT 7.   PAST MEDICAL HISTORY: Past Medical History:  Diagnosis Date  . ADHD   . Anxiety   . Asthma   . Bilateral ovarian cysts   . Depression   . Dysmenorrhea   . Endometriosis   . History of chicken pox   . History of shingles 2009  . HSV-1 infection   . Migraines    with aura  . Ovarian cyst     PAST SURGICAL HISTORY: Past Surgical History:  Procedure Laterality Date  . APPENDECTOMY    . PELVIC LAPAROSCOPY  03/18/2012   laparoscopic exicision of peritoneal lesion (endosalpingosis), indicental appendectomy    MEDICATIONS: Current Outpatient Medications on File Prior to Visit  Medication Sig Dispense Refill  . albuterol (VENTOLIN HFA) 108 (90 Base) MCG/ACT inhaler Inhale 1 puff into the lungs every 6 (six) hours as needed for wheezing or shortness of breath. 18 g 3  . amphetamine-dextroamphetamine (ADDERALL) 20 MG tablet Take 1 tablet (20 mg total) by mouth 2 (two) times daily. 60 tablet 0  . hydrOXYzine (ATARAX/VISTARIL) 25 MG tablet Take 1 or 2 pills 3 times daily as needed for itching. 40 tablet 0  . naproxen (NAPROSYN) 500 MG tablet Take by mouth as needed.     . ondansetron (ZOFRAN ODT) 4 MG disintegrating tablet Take 1 every 6 hours as needed for nausea 20 tablet 0  . rizatriptan (MAXALT) 10 MG tablet Take 1 tablet (10 mg total) by mouth as needed for migraine. May repeat in 2 hours if needed 10 tablet 3  . SUMAtriptan (IMITREX) 25 MG tablet Take 1 tablet (25 mg total) by mouth every 2 (two) hours as needed for migraine. May repeat in 2 hours if headache persists or recurs. 10 tablet 0  . valACYclovir (VALTREX) 500 MG tablet Take 1 tablet (500 mg total) by mouth 2 (two) times daily. Take for three days.  Repeat course at first sign of a new outbreak. 30 tablet 3   No current facility-administered medications on file prior to visit.    ALLERGIES: Allergies  Allergen Reactions  . Wellbutrin [Bupropion] Nausea And  Vomiting    FAMILY HISTORY: Family History  Problem Relation Age of Onset  . Hypertension Father   . Breast cancer Maternal Grandmother   . Hypertension Maternal Grandfather   . Ovarian cancer Paternal Grandmother 28       Dec from ovarian ca  . Cancer Paternal Grandmother        uterine  . Diabetes Paternal Grandmother   . Hypertension Paternal Grandfather   . Kidney cancer Paternal Grandfather     SOCIAL HISTORY: Social History   Socioeconomic History  . Marital status: Single    Spouse name: Not on file  . Number of children: Not on file  . Years of education: Not on file  . Highest education level: Not on file  Occupational History  . Not on file  Tobacco Use  . Smoking status: Never Smoker  . Smokeless tobacco: Never Used  Substance and Sexual Activity  . Alcohol use: Yes    Alcohol/week: 1.0 standard drinks    Types: 1 Glasses of wine per week  . Drug use: No  . Sexual activity: Yes    Birth  control/protection: Condom  Other Topics Concern  . Not on file  Social History Narrative  . Not on file   Social Determinants of Health   Financial Resource Strain:   . Difficulty of Paying Living Expenses:   Food Insecurity:   . Worried About Programme researcher, broadcasting/film/video in the Last Year:   . Barista in the Last Year:   Transportation Needs:   . Freight forwarder (Medical):   Marland Kitchen Lack of Transportation (Non-Medical):   Physical Activity:   . Days of Exercise per Week:   . Minutes of Exercise per Session:   Stress:   . Feeling of Stress :   Social Connections:   . Frequency of Communication with Friends and Family:   . Frequency of Social Gatherings with Friends and Family:   . Attends Religious Services:   . Active Member of Clubs or Organizations:   . Attends Banker Meetings:   Marland Kitchen Marital Status:   Intimate Partner Violence:   . Fear of Current or Ex-Partner:   . Emotionally Abused:   Marland Kitchen Physically Abused:   . Sexually Abused:      PHYSICAL EXAM: There were no vitals taken for this visit. General: No acute distress.  Patient appears well-groomed.   IMPRESSION: Migraine with aura, with status migrainosus, intractable.  She has had adverse reaction to beta blocker (propranolol, dizziness) and topiramate and zonisamide are contraindicated due to recurrent kidney stones.  I would like to try her on Aimovig.  For rescue therapy, she has failed rizatriptan and oral sumatriptan.  Given that she has intractable headaches with nausea, I would like her to try Tosymra nasal spray for fast onset of action.  PLAN: 1.  For preventative management, start Aimovig 70mg  every 28 days.  She is receiving her second Covid vaccine on 4/8, so I recommended starting the Aimovig 2 weeks after the shot.   2.  For abortive therapy, Tosymra.  Stop rizatriptan and sumatriptan tablet 3.  Limit use of pain relievers to no more than 2 days out of week to prevent risk of rebound or medication-overuse headache. 4.  Keep headache diary 5.  Exercise, hydration, caffeine cessation, sleep hygiene, monitor for and avoid triggers 6.  Consider:  magnesium citrate 400mg  daily, riboflavin 400mg  daily, and coenzyme Q10 100mg  three times daily 7. Follow up 4 months.   Follow Up Instructions:    -I discussed the assessment and treatment plan with the patient. The patient was provided an opportunity to ask questions and all were answered. The patient agreed with the plan and demonstrated an understanding of the instructions.   The patient was advised to call back or seek an in-person evaluation if the symptoms worsen or if the condition fails to improve as anticipated.    6/8, DO

## 2020-01-19 ENCOUNTER — Telehealth (INDEPENDENT_AMBULATORY_CARE_PROVIDER_SITE_OTHER): Payer: PRIVATE HEALTH INSURANCE | Admitting: Neurology

## 2020-01-19 ENCOUNTER — Encounter: Payer: Self-pay | Admitting: Neurology

## 2020-01-19 ENCOUNTER — Other Ambulatory Visit: Payer: Self-pay

## 2020-01-19 DIAGNOSIS — G43111 Migraine with aura, intractable, with status migrainosus: Secondary | ICD-10-CM

## 2020-01-19 MED ORDER — AIMOVIG 70 MG/ML ~~LOC~~ SOAJ: 70.0000 mg | SUBCUTANEOUS | 11 refills | Status: DC

## 2020-01-19 MED ORDER — TOSYMRA 10 MG/ACT NA SOLN: 1.0000 | NASAL | 11 refills | Status: DC | PRN

## 2020-01-25 ENCOUNTER — Encounter: Payer: Self-pay | Admitting: Neurology

## 2020-01-25 NOTE — Progress Notes (Addendum)
Hayley Jensen (Key: [id number]) Rx #: [id number] Aimovig 70MG /ML auto-injectors   Form [employer] [facility] Electronic Prior Authorization Form ([employer]olve) (CB) 2017 NCPDP Created 1 hour ago Sent to Plan 19 minutes ago Plan Response 19 minutes ago Submit Clinical Questions 10 minutes ago Determination Favorable less than a minute ago Message from Plan Approved for generic AIMOVIG (Erenumab-aooe Subcutaneous Solution Auto-Injector 70 MG/ML), quantity up to 1 ml per 28 days, under the pharmacy benefit. Generic substitution required when available.

## 2020-01-26 ENCOUNTER — Encounter: Payer: Self-pay | Admitting: *Deleted

## 2020-01-26 NOTE — Progress Notes (Addendum)
Hayley Jensen (Key: [id number]) Tosymra 10MG /ACT solution   Form [employer] [facility] Electronic Prior Authorization Form (Envolve) (CB) 2017 NCPDP Created 5 days ago Sent to Plan 5 days ago Plan Response 5 days ago Submit Clinical Questions 5 days ago Determination Favorable 4 days ago Message from Plan Approved for generic TOSYMRA (Sumatriptan Nasal Spray 10 MG/ACT), quantity up to 8 per 30 days, under the pharmacy benefit. Generic substitution required when available.  Fax was sent to scan into her chart

## 2020-04-12 ENCOUNTER — Encounter: Payer: Self-pay | Admitting: Neurology

## 2020-04-12 NOTE — Progress Notes (Signed)
Hayley Jensen (Key: [id number]) Aimovig 70MG /ML auto-injectors   Form [employer] [facility] Electronic Prior Authorization Form (Envolve) 2017 NCPDP Created 17 hours ago Sent to Plan 5 minutes ago Plan Response 5 minutes ago Submit Clinical Questions less than a minute ago Determination Wait for Determination Please wait for [employer] AM Better 2017 MHK to return a determination.

## 2020-04-19 NOTE — Progress Notes (Addendum)
Denial on the medication thru CMM. Completed appeal on CMM; awaiting determination.   APPROVED- received fax valid 05/08/20 to 11/08/20.

## 2020-05-22 NOTE — Progress Notes (Signed)
Virtual Visit via Video Note The purpose of this virtual visit is to provide medical care while limiting exposure to the novel coronavirus.    Consent was obtained for video visit:  Yes.   Answered questions that patient had about telehealth interaction:  Yes.   I discussed the limitations, risks, security and privacy concerns of performing an evaluation and management service by telemedicine. I also discussed with the patient that there may be a patient responsible charge related to this service. The patient expressed understanding and agreed to proceed.  Pt location: Home Physician Location: office Name of referring provider:  Jarold Motto, [place] I connected with Hayley Jensen at patients initiation/request on 05/23/2020 at  8:30 AM EDT by video enabled telemedicine application and verified that I am speaking with the correct person using two identifiers. Pt MRN:  [id number] Pt DOB:  1996/03/30 Video Participants:  Hayley Jensen   History of Present Illness:  Hayley Jensen is a 24 year old female with ADHD and anxiety and history of kidney stones twice who follows up for migraines.  UPDATE: She reports dry mouth for 2 to 3 days after Aimovig injection and now notices dry mouth following a migraine as well.   Intensity:  severe Duration:  3 to 4 days (relief in 30 minutes but severity returns next day Frequency:  10 to 12 a day Rescue therapy:  Tosymra Analgesic frequency:   Current NSAIDS:  Naproxen 500mg  (endometriosis) Current analgesics:  none Current triptans:   Tosymra NS Current ergotamine:  none Current anti-emetic:  Zofran ODT 8mg  Current muscle relaxants:  none Current anti-anxiolytic:  none Current sleep aide:  none Current Antihypertensive medications:  none Current Antidepressant medications:  none Current Anticonvulsant medications:  none Current anti-CGRP:  Aimovig 70mg  Current Vitamins/Herbal/Supplements:  none Current Antihistamines/Decongestants:   none Other therapy:  Ginger candy for nausea Hormone/birth control:  none Other medications:  Adderall PRN  Caffeine:  Tea.  Rarely coffee.  No energy drinks.  Cola only for headache. Diet:  64 oz water daily.  Skips meals.  But grazes during day.   Exercise:  walks Depression:  no; Anxiety:  yes.  Currently in law school. Other pain:  endometriosis Sleep hygiene:  7 to 9 hours sleep a night.  HISTORY: She started having migraines at age 78 to 19.  She had a visual aura with spinning or horizontal waves of rainbow colors in both eyes that impede vision, followed by nausea and vomiting.  They have progressed over the years and since around 24 years old they have been lasting several days at a time.  She still sometimes gets the visual aura.  Severe bifrontal pressure pain.  Sometimes right eye is pain worse with eye movement.  Neck and body stiffness.  Nose burns and turns red.  Ears ring and pop.  Severe nausea.  Sometimes vomiting.  Some photophobia, phonophobia, osmophobia, sometimes altered sense of smell.  No numbness or weakness.  Triggers include change in weather but does not appear to be associated with food or menstrual cycle.  No real relieving factors.  They last 10 to 14 days.  They occur once a month.    Eye exam has been unremarkable.     Past NSAIDS:  ibuprofen Past analgesics:  Fioricet; Tylenol; Excedrin Past abortive triptans:  rizatriptan, sumatriptan 25mg  Past abortive ergotamine:  none Past muscle relaxants:  none Past anti-emetic:  none Past antihypertensive medications:  Propranolol (dizziness, fatigue) Past antidepressant medications:  Citalopram  Past anticonvulsant medications:  none Past anti-CGRP:  none Past vitamins/Herbal/Supplements:  none Past antihistamines/decongestants:  none Other past therapies:  Sleep; peppermint; some cola   Family history of headache:  Mother (migraines); maternal grandfather (migraines with seizures)  Past Medical  History: Past Medical History:  Diagnosis Date  . ADHD   . Anxiety   . Asthma   . Bilateral ovarian cysts   . Depression   . Dysmenorrhea   . Endometriosis   . History of chicken pox   . History of shingles 2009  . HSV-1 infection   . Migraines    with aura  . Ovarian cyst     Medications: Outpatient Encounter Medications as of 05/23/2020  Medication Sig Note  . albuterol (VENTOLIN HFA) 108 (90 Base) MCG/ACT inhaler Inhale 1 puff into the lungs every 6 (six) hours as needed for wheezing or shortness of breath.   . amphetamine-dextroamphetamine (ADDERALL) 20 MG tablet Take 1 tablet (20 mg total) by mouth 2 (two) times daily.   Dorise Hiss (AIMOVIG) 70 MG/ML SOAJ Inject 70 mg into the skin every 28 (twenty-eight) days.   . hydrOXYzine (ATARAX/VISTARIL) 25 MG tablet Take 1 or 2 pills 3 times daily as needed for itching. (Patient not taking: Reported on 01/19/2020)   . naproxen (NAPROSYN) 500 MG tablet Take by mouth as needed.  07/03/2018: prn  . ondansetron (ZOFRAN ODT) 4 MG disintegrating tablet Take 1 every 6 hours as needed for nausea   . SUMAtriptan (TOSYMRA) 10 MG/ACT SOLN Place 1 spray into the nose as needed (May repeat every 1 hour as needed.  Maximum 3 sprays in 24 hours).   . valACYclovir (VALTREX) 500 MG tablet Take 1 tablet (500 mg total) by mouth 2 (two) times daily. Take for three days.  Repeat course at first sign of a new outbreak.    No facility-administered encounter medications on file as of 05/23/2020.    Allergies: Allergies  Allergen Reactions  . Wellbutrin [Bupropion] Nausea And Vomiting    Family History: Family History  Problem Relation Age of Onset  . Hypertension Father   . Breast cancer Maternal Grandmother   . Hypertension Maternal Grandfather   . Ovarian cancer Paternal Grandmother 2       Dec from ovarian ca  . Cancer Paternal Grandmother        uterine  . Diabetes Paternal Grandmother   . Hypertension Paternal Grandfather   . Kidney  cancer Paternal Grandfather     Social History: Social History   Socioeconomic History  . Marital status: Single    Spouse name: Not on file  . Number of children: Not on file  . Years of education: Not on file  . Highest education level: Not on file  Occupational History  . Not on file  Tobacco Use  . Smoking status: Never Smoker  . Smokeless tobacco: Never Used  Vaping Use  . Vaping Use: Never used  Substance and Sexual Activity  . Alcohol use: Yes    Alcohol/week: 1.0 standard drink    Types: 1 Glasses of wine per week  . Drug use: No  . Sexual activity: Yes    Birth control/protection: Condom  Other Topics Concern  . Not on file  Social History Narrative  . Not on file   Social Determinants of Health   Financial Resource Strain:   . Difficulty of Paying Living Expenses:   Food Insecurity:   . Worried About Programme researcher, broadcasting/film/video in the Last Year:   .  Ran Out of Food in the Last Year:   Transportation Needs:   . Freight forwarder (Medical):   Marland Kitchen Lack of Transportation (Non-Medical):   Physical Activity:   . Days of Exercise per Week:   . Minutes of Exercise per Session:   Stress:   . Feeling of Stress :   Social Connections:   . Frequency of Communication with Friends and Family:   . Frequency of Social Gatherings with Friends and Family:   . Attends Religious Services:   . Active Member of Clubs or Organizations:   . Attends Banker Meetings:   Marland Kitchen Marital Status:   Intimate Partner Violence:   . Fear of Current or Ex-Partner:   . Emotionally Abused:   Marland Kitchen Physically Abused:   . Sexually Abused:     Observations/Objective:   There were no vitals taken for this visit. No acute distress.  Alert and oriented.  Speech fluent and not dysarthric.  Language intact.    Assessment and Plan:   Migraine with aura, without status migrainosus, intractable but improved.  1.  For preventative management, increase Aimovig to 140mg  every 28 days 2.   For abortive therapy, continue Tosymra for now.   3.  Limit use of pain relievers to no more than 2 days out of week to prevent risk of rebound or medication-overuse headache. 4.  Keep headache diary 5.  Exercise, hydration, caffeine cessation, sleep hygiene, monitor for and avoid triggers 6. Follow up 4 to 6 months   Follow Up Instructions:    -I discussed the assessment and treatment plan with the patient. The patient was provided an opportunity to ask questions and all were answered. The patient agreed with the plan and demonstrated an understanding of the instructions.   The patient was advised to call back or seek an in-person evaluation if the symptoms worsen or if the condition fails to improve as anticipated.   , DO

## 2020-05-23 ENCOUNTER — Other Ambulatory Visit: Payer: Self-pay

## 2020-05-23 ENCOUNTER — Telehealth (INDEPENDENT_AMBULATORY_CARE_PROVIDER_SITE_OTHER): Payer: PRIVATE HEALTH INSURANCE | Admitting: Neurology

## 2020-05-23 ENCOUNTER — Encounter: Payer: Self-pay | Admitting: Neurology

## 2020-05-23 DIAGNOSIS — G43111 Migraine with aura, intractable, with status migrainosus: Secondary | ICD-10-CM | POA: Diagnosis not present

## 2020-05-23 MED ORDER — AIMOVIG 140 MG/ML ~~LOC~~ SOAJ: 140.0000 mg | SUBCUTANEOUS | 5 refills | Status: DC

## 2020-06-26 NOTE — Progress Notes (Signed)
Hayley Jensen is a 24 y.o. female here for a new problem.  I acted as a Neurosurgeon for [employer], PA-C Corky Mull, LPN   History of Present Illness:   Chief Complaint  Patient presents with  . Vaginal Discharge    HPI   Vaginal discharge Pt c/o white vaginal discharge with fishy odor x 1.5 weeks. Pt started her period today. Denies pelvic pain or back pain, bleeding after sexual intercourse, no fever or chills, denies concerns for STDs.  Past Medical History:  Diagnosis Date  . ADHD   . Anxiety   . Asthma   . Bilateral ovarian cysts   . Depression   . Dysmenorrhea   . Endometriosis   . History of chicken pox   . History of shingles 2009  . HSV-1 infection   . Migraines    with aura  . Ovarian cyst      Social History   Tobacco Use  . Smoking status: Never Smoker  . Smokeless tobacco: Never Used  Vaping Use  . Vaping Use: Never used  Substance Use Topics  . Alcohol use: Yes    Alcohol/week: 1.0 standard drink    Types: 1 Glasses of wine per week  . Drug use: No    Past Surgical History:  Procedure Laterality Date  . APPENDECTOMY    . PELVIC LAPAROSCOPY  03/18/2012   laparoscopic exicision of peritoneal lesion (endosalpingosis), indicental appendectomy    Family History  Problem Relation Age of Onset  . Hypertension Father   . Breast cancer Maternal Grandmother   . Hypertension Maternal Grandfather   . Ovarian cancer Paternal Grandmother 45       Dec from ovarian ca  . Cancer Paternal Grandmother        uterine  . Diabetes Paternal Grandmother   . Hypertension Paternal Grandfather   . Kidney cancer Paternal Grandfather     Allergies  Allergen Reactions  . Wellbutrin [Bupropion] Nausea And Vomiting    Current Medications:   Current Outpatient Medications:  .  albuterol (VENTOLIN HFA) 108 (90 Base) MCG/ACT inhaler, Inhale 1 puff into the lungs every 6 (six) hours as needed for wheezing or shortness of breath., Disp: 18 g, Rfl: 3 .   amphetamine-dextroamphetamine (ADDERALL) 20 MG tablet, Take 1 tablet (20 mg total) by mouth 2 (two) times daily., Disp: 60 tablet, Rfl: 0 .  Erenumab-aooe (AIMOVIG) 140 MG/ML SOAJ, Inject 140 mg into the skin every 28 (twenty-eight) days., Disp: 1 pen, Rfl: 5 .  hydrOXYzine (ATARAX/VISTARIL) 25 MG tablet, Take 1 or 2 pills 3 times daily as needed for itching., Disp: 40 tablet, Rfl: 0 .  naproxen (NAPROSYN) 500 MG tablet, Take by mouth as needed. , Disp: , Rfl:  .  ondansetron (ZOFRAN ODT) 4 MG disintegrating tablet, Take 1 every 6 hours as needed for nausea, Disp: 20 tablet, Rfl: 0 .  SUMAtriptan (TOSYMRA) 10 MG/ACT SOLN, Place 1 spray into the nose as needed (May repeat every 1 hour as needed.  Maximum 3 sprays in 24 hours)., Disp: 9 each, Rfl: 11 .  valACYclovir (VALTREX) 500 MG tablet, Take 1 tablet (500 mg total) by mouth 2 (two) times daily. Take for three days.  Repeat course at first sign of a new outbreak., Disp: 30 tablet, Rfl: 3 .  fluconazole (DIFLUCAN) 150 MG tablet, Take 1 tablet (150 mg total) by mouth once for 1 dose., Disp: 1 tablet, Rfl: 0 .  metroNIDAZOLE (FLAGYL) 500 MG tablet, Take 1 tablet (  500 mg total) by mouth 3 (three) times daily., Disp: 21 tablet, Rfl: 0   Review of Systems:   ROS  Negative unless otherwise specified per HPI.  Vitals:   Vitals:   06/27/20 0733  BP: 90/60  Pulse: 62  Temp: 97.6 F (36.4 C)  TempSrc: Temporal  SpO2: 98%  Weight: 92 lb 8 oz (42 kg)  Height: 5\' 3"  (1.6 m)     Body mass index is 16.39 kg/m.  Physical Exam:   Physical Exam Vitals and nursing note reviewed.  Constitutional:      General: She is not in acute distress.    Appearance: She is well-developed. She is not ill-appearing or toxic-appearing.  Cardiovascular:     Rate and Rhythm: Normal rate and regular rhythm.     Pulses: Normal pulses.     Heart sounds: Normal heart sounds, S1 normal and S2 normal.     Comments: No LE edema Pulmonary:     Effort: Pulmonary  effort is normal.     Breath sounds: Normal breath sounds.  Skin:    General: Skin is warm and dry.  Neurological:     Mental Status: She is alert.     GCS: GCS eye subscore is 4. GCS verbal subscore is 5. GCS motor subscore is 6.  Psychiatric:        Speech: Speech normal.        Behavior: Behavior normal. Behavior is cooperative.      Assessment and Plan:   Itha was seen today for vaginal discharge.  Diagnoses and all orders for this visit:  Acute vaginitis Declined vaginal swab today. Will trial oral diflucan and after completion of antibiotic, will trial oral diflucan. Follow-up in office for vaginal swab if symptoms persist despite treatment.  Other orders -     metroNIDAZOLE (FLAGYL) 500 MG tablet; Take 1 tablet (500 mg total) by mouth 3 (three) times daily. -     fluconazole (DIFLUCAN) 150 MG tablet; Take 1 tablet (150 mg total) by mouth once for 1 dose.  Reviewed expectations re: course of current medical issues. Discussed self-management of symptoms. Outlined signs and symptoms indicating need for more acute intervention. Patient verbalized understanding and all questions were answered. See orders for this visit as documented in the electronic medical record. Patient received an After-Visit Summary.  CMA or LPN served as scribe during this visit. History, Physical, and Plan performed by medical provider. The above documentation has been reviewed and is accurate and complete.  Cruzita Lederer, PA-C

## 2020-06-27 ENCOUNTER — Encounter: Payer: Self-pay | Admitting: Physician Assistant

## 2020-06-27 ENCOUNTER — Other Ambulatory Visit: Payer: Self-pay

## 2020-06-27 ENCOUNTER — Ambulatory Visit (INDEPENDENT_AMBULATORY_CARE_PROVIDER_SITE_OTHER): Payer: PRIVATE HEALTH INSURANCE | Admitting: Physician Assistant

## 2020-06-27 VITALS — BP 90/60 | HR 62 | Temp 97.6°F | Ht 63.0 in | Wt 92.5 lb

## 2020-06-27 DIAGNOSIS — N760 Acute vaginitis: Secondary | ICD-10-CM

## 2020-06-27 DIAGNOSIS — Z23 Encounter for immunization: Secondary | ICD-10-CM

## 2020-06-27 MED ORDER — METRONIDAZOLE 500 MG PO TABS: 500.0000 mg | ORAL_TABLET | Freq: Three times a day (TID) | ORAL | 0 refills | Status: DC

## 2020-06-27 MED ORDER — METRONIDAZOLE 500 MG PO TABS: 500.0000 mg | ORAL_TABLET | Freq: Two times a day (BID) | ORAL | 0 refills | Status: AC

## 2020-06-27 MED ORDER — FLUCONAZOLE 150 MG PO TABS: 150.0000 mg | ORAL_TABLET | Freq: Once | ORAL | 0 refills | Status: AC

## 2020-06-27 NOTE — Patient Instructions (Signed)
It was great to see you!  Start oral flagyl for likely bv.  Oral diflucan has been sent in for after completion of the antibiotic for concerns for yeast.  If no improvement, we will likely need to do a vaginal swab to see what's going on.  Take care,  Jarold Motto PA-C

## 2020-07-13 ENCOUNTER — Telehealth: Payer: Self-pay | Admitting: Neurology

## 2020-07-13 NOTE — Telephone Encounter (Signed)
Patient called in wanting to find out if she can get a sample of Nurtec while she is in town?

## 2020-07-13 NOTE — Telephone Encounter (Signed)
Yes, she may come by and pick up two boxes of Nurtec samples (along with a copay card).  She may take one tablet daily as needed (maximum 1 tablet in 24 hours).  If effective, she should contact us for prescription and she may use the copay card.

## 2020-07-13 NOTE — Telephone Encounter (Signed)
Per pt she states she was advised by [facility] that she could call and ask for Nurtec samples if the Tosymra not Working.   Please advised.

## 2020-07-14 NOTE — Telephone Encounter (Signed)
LMOVM for pt. Samples at front desk.

## 2020-07-28 ENCOUNTER — Encounter: Payer: Self-pay | Admitting: Neurology

## 2020-07-28 NOTE — Progress Notes (Addendum)
Hayley Jensen (Key: [id number]) Tosymra 10MG /ACT solution   Form [facility] Electronic Prior Authorization Form ([employer]) 2017 NCPDP Created 3 days ago Sent to Plan 3 days ago Plan Response 3 days ago Submit Clinical Questions 3 days ago Determination Favorable 5 minutes ago Message from Plan Approved. Approved for TOSYMRA Solution, quantity up to 9 per 30 days, under the pharmacy benefit. The drug has been approved from 07/31/2020 to 07/31/2021. Generic substitution required when available.

## 2020-07-31 ENCOUNTER — Encounter: Payer: Self-pay | Admitting: Obstetrics and Gynecology

## 2020-07-31 ENCOUNTER — Telehealth: Payer: Self-pay

## 2020-07-31 NOTE — Telephone Encounter (Signed)
Pt sent following Mychart message:  Kem, Nya N  P [facility] Clinical Pool Good afternoon,   I would like to discuss the possibility of getting an IUD. I do not want to take pill birth control again and I am not completely open to having something with hormones, but would possibly consider a hormonal IUD if it was decided that is best. A big concern is that I already have heavy periods. I also don't know what pros and cons I need to weigh in making this decision.   Thanks,  [employer]

## 2020-07-31 NOTE — Telephone Encounter (Signed)
AEX 11/2019 H/o endometriosis  Migraines with auras, stopped OCPs 2018 H/o HSV 1, on valtrex Rx.   Spoke with pt. Pt states wanting to get IUD for birth control. Pt states not interested in pregnancy planning at this time and would like to discuss options of IUDs. Pt advised to have OV or Mychart visit with Dr Edward Jolly to discuss and decide before making an appointment. Pt agreeable.  Pt scheduled Mychart visit on 10/13 at 8 am per pt's request due to being a student at [employer].  Pt agreeable and verbalized understanding to date and time of appt.  Routing to Dr Edward Jolly for review.  Encounter closed.

## 2020-08-08 NOTE — Progress Notes (Signed)
GYNECOLOGY  VISIT   HPI: 24 y.o.   Single  Caucasian  female   [id number] with Patient's last menstrual period was 08/03/2020 (exact date).   here for MyChart video visit. Would like to discuss birth control options particularly IUD.    Patient confirms her identity.  She gives permission for this visit.  I am in my office.  She is in her condo. Lasted 28 minutes.   She would like contraception.  She does not want to have acne, but it not her primary concern.  Her pelvic pain has lessened.  She has cramps sometimes with her cycles.  Menses last 4 days and she has one heavy day, with tampon change every 30 minutes.   Used birth control in the past, but she has migraines, so it was stopped.   She did not use Depo Provera in the past due to concern about weight gain.  She currently weighs 90 pounds.   She does request sedation for the procedure.   GYNECOLOGIC HISTORY: Patient's last menstrual period was 08/03/2020 (exact date). Contraception:  Menopausal hormone therapy:  n/a Last mammogram:  n/a Last pap smear: 12-14-19 Neg:Neg HR HPV, 2016 normal per patient        OB History    Gravida  0   Para  0   Term  0   Preterm  0   AB  0   Living  0     SAB  0   TAB  0   Ectopic  0   Multiple  0   Live Births  0              Patient Active Problem List   Diagnosis Date Noted  . Migraine with aura, intractable 03/09/2019  . Dysmenorrhea 05/18/2016  . Attention deficit hyperactivity disorder (ADHD), predominantly inattentive type 09/26/2014  . Cyst of right ovary 04/21/2014  . Asthma 10/28/2010  . GAD (generalized anxiety disorder) 10/28/2008    Past Medical History:  Diagnosis Date  . ADHD   . Anxiety   . Asthma   . Bilateral ovarian cysts   . Depression   . Dysmenorrhea   . Endometriosis   . History of chicken pox   . History of shingles 2009  . HSV-1 infection   . Migraines    with aura  . Ovarian cyst     Past Surgical History:  Procedure  Laterality Date  . APPENDECTOMY    . PELVIC LAPAROSCOPY  03/18/2012   laparoscopic exicision of peritoneal lesion (endosalpingosis), indicental appendectomy    Current Outpatient Medications  Medication Sig Dispense Refill  . albuterol (VENTOLIN HFA) 108 (90 Base) MCG/ACT inhaler Inhale 1 puff into the lungs every 6 (six) hours as needed for wheezing or shortness of breath. 18 g 3  . amphetamine-dextroamphetamine (ADDERALL) 20 MG tablet Take 1 tablet (20 mg total) by mouth 2 (two) times daily. 60 tablet 0  . Erenumab-aooe (AIMOVIG) 140 MG/ML SOAJ Inject 140 mg into the skin every 28 (twenty-eight) days. 1 pen 5  . hydrOXYzine (ATARAX/VISTARIL) 25 MG tablet Take 1 or 2 pills 3 times daily as needed for itching. 40 tablet 0  . naproxen (NAPROSYN) 500 MG tablet Take by mouth as needed.     . ondansetron (ZOFRAN ODT) 4 MG disintegrating tablet Take 1 every 6 hours as needed for nausea 20 tablet 0  . SUMAtriptan (TOSYMRA) 10 MG/ACT SOLN Place 1 spray into the nose as needed (May repeat every 1 hour as  needed.  Maximum 3 sprays in 24 hours). 9 each 11  . valACYclovir (VALTREX) 500 MG tablet Take 1 tablet (500 mg total) by mouth 2 (two) times daily. Take for three days.  Repeat course at first sign of a new outbreak. 30 tablet 3   No current facility-administered medications for this visit.    Rimegepant for migraine headaches.   ALLERGIES: Wellbutrin [bupropion]  Family History  Problem Relation Age of Onset  . Hypertension Father   . Breast cancer Maternal Grandmother   . Hypertension Maternal Grandfather   . Ovarian cancer Paternal Grandmother 97       Dec from ovarian ca  . Cancer Paternal Grandmother        uterine  . Diabetes Paternal Grandmother   . Hypertension Paternal Grandfather   . Kidney cancer Paternal Grandfather     Social History   Socioeconomic History  . Marital status: Single    Spouse name: Not on file  . Number of children: Not on file  . Years of education:  Not on file  . Highest education level: Not on file  Occupational History  . Not on file  Tobacco Use  . Smoking status: Never Smoker  . Smokeless tobacco: Never Used  Vaping Use  . Vaping Use: Never used  Substance and Sexual Activity  . Alcohol use: Yes    Alcohol/week: 1.0 standard drink    Types: 1 Glasses of wine per week  . Drug use: No  . Sexual activity: Yes    Birth control/protection: Condom  Other Topics Concern  . Not on file  Social History Narrative  . Not on file   Social Determinants of Health   Financial Resource Strain:   . Difficulty of Paying Living Expenses: Not on file  Food Insecurity:   . Worried About Programme researcher, broadcasting/film/video in the Last Year: Not on file  . Ran Out of Food in the Last Year: Not on file  Transportation Needs:   . Lack of Transportation (Medical): Not on file  . Lack of Transportation (Non-Medical): Not on file  Physical Activity:   . Days of Exercise per Week: Not on file  . Minutes of Exercise per Session: Not on file  Stress:   . Feeling of Stress : Not on file  Social Connections:   . Frequency of Communication with Friends and Family: Not on file  . Frequency of Social Gatherings with Friends and Family: Not on file  . Attends Religious Services: Not on file  . Active Member of Clubs or Organizations: Not on file  . Attends Banker Meetings: Not on file  . Marital Status: Not on file  Intimate Partner Violence:   . Fear of Current or Ex-Partner: Not on file  . Emotionally Abused: Not on file  . Physically Abused: Not on file  . Sexually Abused: Not on file    Review of Systems  All other systems reviewed and are negative.   PHYSICAL EXAMINATION:    LMP 08/03/2020 (Exact Date)     General appearance: alert, cooperative and appears stated age  ASSESSMENT  Desire for contraception.   Pelvic pain improved.   PLAN  IUDs, Nexplanon, Depo Provera, Micronor discussed.  IUDs discussed in detail, risks and  benefits.   She would like focus on Palau or Mirena.  Will precert this.  Patient will come to the office to sign her consent form in the next couple of days.  She asks for  sedation for there procedure.  Will plan for valium 5 mg po x 1.  Disp:  2, RF:  Zero.  Will also plan for paracervical block and Cytotec 200 mcg pv the night before procedure and the am of the procedure.  Consider STD testing (GC/CT/trich) the day of her IUD placement.

## 2020-08-09 ENCOUNTER — Encounter: Payer: Self-pay | Admitting: Obstetrics and Gynecology

## 2020-08-09 ENCOUNTER — Telehealth (INDEPENDENT_AMBULATORY_CARE_PROVIDER_SITE_OTHER): Payer: PRIVATE HEALTH INSURANCE | Admitting: Obstetrics and Gynecology

## 2020-08-09 DIAGNOSIS — Z3009 Encounter for other general counseling and advice on contraception: Secondary | ICD-10-CM | POA: Diagnosis not present

## 2020-08-09 NOTE — Telephone Encounter (Signed)
See MyChart visit dated 08/09/20.   Encounter closed.

## 2020-08-10 ENCOUNTER — Encounter: Payer: Self-pay | Admitting: Obstetrics and Gynecology

## 2020-08-16 ENCOUNTER — Telehealth: Payer: Self-pay

## 2020-08-16 NOTE — Telephone Encounter (Signed)
Call to patient. Per DPR, OK to leave message on voicemail.   Left voicemail requesting a return call to [facility] to review benefits and schedule recommended IUD Insertion with Brook A. Edward Jolly, MD, Evern Core

## 2020-08-23 MED ORDER — DIAZEPAM 5 MG PO TABS: ORAL_TABLET | ORAL | 0 refills | Status: DC

## 2020-08-23 MED ORDER — MISOPROSTOL 200 MCG PO TABS: ORAL_TABLET | ORAL | 0 refills | Status: DC

## 2020-08-23 NOTE — Telephone Encounter (Signed)
Spoke with patient regarding benefits for recommended IUD insertion. Patient acknowledges understanding of information presented.   Call transferred to Carmelina Dane, RN, to schedule and discuss consent form for sedation.

## 2020-08-23 NOTE — Telephone Encounter (Signed)
Spoke with patient.  LMP 08/03/20. Patient is SA, condoms most of the time. Has been SA in the past 2 wks. Reviewed 08/09/20 OV notes per Dr. Edward Jolly.   She would like focus on Palau or Mirena.  Will precert this.  Patient will come to the office to sign her consent form in the next couple of days.  She asks for sedation for there procedure.  Will plan for valium 5 mg po x 1.  Disp:  2, RF:  Zero.  Will also plan for paracervical block and Cytotec 200 mcg pv the night before procedure and the am of the procedure.    Rx for cytotec sent to verified pharmacy.  Advised Rx for Valium will be a printed Rx, can pick up when she comes in to sign consent. Advised to take Motrin 800 mg with food and water one hour before procedure.  Instructed patient to call with start of next menses to schedule IUD insertion.  Patient verbalizes understanding and is agreeable.

## 2020-08-24 ENCOUNTER — Telehealth: Payer: Self-pay

## 2020-08-24 NOTE — Telephone Encounter (Signed)
Pt here in office to sign consents and pick up Rx valium.  Pt signed both Mirena and Rutha Bouchard IUD consents. All questions answered and given instructions on how to take Rx Valium before appt. Pt to return call to office to schedule IUD insertion with next cycle start. Pt verbalized understanding.

## 2020-08-24 NOTE — Telephone Encounter (Signed)
FYI

## 2020-08-24 NOTE — Telephone Encounter (Signed)
Nurse Assessment Nurse: Kizzie Bane, RN, Marylene Land Date/Time Lamount Cohen Time): 08/24/2020 9:20:23 AM Confirm and document reason for call. If symptomatic, describe symptoms. ---Caller states she is having dizziness that started yesterday. She has a history of vertigo. Does the patient have any new or worsening symptoms? ---Yes Will a triage be completed? ---Yes Related visit to physician within the last 2 weeks? ---No Does the PT have any chronic conditions? (i.e. diabetes, asthma, this includes High risk factors for pregnancy, etc.) ---Yes List chronic conditions. ---asthma, migraines Is the patient pregnant or possibly pregnant? (Ask all females between the ages of 24-55) ---No Is this a behavioral health or substance abuse call? ---No Guidelines Guideline Title Affirmed Question Affirmed Notes Nurse Date/Time (Eastern Time) Dizziness - Vertigo [1] MODERATE dizziness (e.g., vertigo; feels very unsteady, interferes with normal activities) AND [2] has NOT been evaluated by physician for this Kizzie Bane, RN, Marylene Land 08/24/2020 9:22:49 AM Disp. Time Lamount Cohen Time) Disposition Final User 08/24/2020 9:26:46 AM See PCP within 24 Hours Yes Kizzie Bane, RN, Marylene Land PLEASE NOTE: All timestamps contained within this report are represented as [place] Standard Time. CONFIDENTIALTY NOTICE: This fax transmission is intended only for the addressee. It contains information that is legally privileged, confidential or otherwise protected from use or disclosure. If you are not the intended recipient, you are strictly prohibited from reviewing, disclosing, copying using or disseminating any of this information or taking any action in reliance on or regarding this information. If you have received this fax in error, please notify us immediately by telephone so that we can arrange for its return to [place]. Phone: [phone], Toll-Free: [phone], Fax: [phone] Page: 2 of 2 Call Id: [id number] Caller Disagree/Comply  Comply Caller Understands Yes PreDisposition Did not know what to do Care Advice Given Per Guideline SEE PCP WITHIN 24 HOURS: * Severe headache occurs CALL BACK IF: * Weakness develops in an arm or leg * Unable to walk without falling * You become worse CARE ADVICE given per Dizziness - Vertigo (Adult) guideline. Referrals REFERRED TO PCP OFFIC

## 2020-08-24 NOTE — Telephone Encounter (Signed)
Procedure consents to [employer].   Encounter closed.

## 2020-08-25 ENCOUNTER — Ambulatory Visit (INDEPENDENT_AMBULATORY_CARE_PROVIDER_SITE_OTHER): Payer: PRIVATE HEALTH INSURANCE | Admitting: Physician Assistant

## 2020-08-25 ENCOUNTER — Other Ambulatory Visit: Payer: Self-pay

## 2020-08-25 ENCOUNTER — Encounter: Payer: Self-pay | Admitting: Physician Assistant

## 2020-08-25 VITALS — BP 102/70 | HR 90 | Temp 98.2°F | Ht 63.0 in | Wt 93.2 lb

## 2020-08-25 DIAGNOSIS — R071 Chest pain on breathing: Secondary | ICD-10-CM | POA: Diagnosis not present

## 2020-08-25 LAB — D-DIMER, QUANTITATIVE: D-Dimer, Quant: <0.19 mcg/mL FEU (ref <0.50)

## 2020-08-25 NOTE — Progress Notes (Signed)
Hayley Jensen is a 24 y.o. female here for a new problem.  I acted as a Neurosurgeon for [employer], PA-C Corky Mull, LPN   History of Present Illness:   Chief Complaint  Patient presents with  . Dizziness  . Chest Pain    HPI   Chest pain Pt c/o dizziness and chest pain, hands are cold, palpitations, SOB and hurts when she takes a deep breath. Symptoms started Wednesday when she was in class.  She went to Urgent care yesterday had normal CBC, CMP, TSH, chest xray, COVID test, UPT. EKG showed inverted p-waves. She was referred to cardiology for further evaluation, she has no scheduled appointment yet. Denies anxiety.  Chest pain today is worse. It is not reproducible to touch. Denies: significant anxiety, syncope, vision changes, n/v/d/c, URI symptoms. She has remote history of asthma, uses albuterol sparingly. Has been eating and drinking normally.  Wt Readings from Last 5 Encounters:  08/25/20 93 lb 4 oz (42.3 kg)  06/27/20 92 lb 8 oz (42 kg)  05/22/20 (!) 95 lb (43.1 kg)  01/03/20 97 lb (44 kg)  12/14/19 98 lb (44.5 kg)     Past Medical History:  Diagnosis Date  . ADHD   . Anxiety   . Asthma   . Bilateral ovarian cysts   . Depression   . Dysmenorrhea   . Endometriosis   . History of chicken pox   . History of shingles 2009  . HSV-1 infection   . Migraines    with aura  . Ovarian cyst      Social History   Tobacco Use  . Smoking status: Never Smoker  . Smokeless tobacco: Never Used  Vaping Use  . Vaping Use: Never used  Substance Use Topics  . Alcohol use: Yes    Alcohol/week: 1.0 standard drink    Types: 1 Glasses of wine per week  . Drug use: No    Past Surgical History:  Procedure Laterality Date  . APPENDECTOMY    . PELVIC LAPAROSCOPY  03/18/2012   laparoscopic exicision of peritoneal lesion (endosalpingosis), indicental appendectomy    Family History  Problem Relation Age of Onset  . Hypertension Father   . Breast cancer Maternal  Grandmother   . Hypertension Maternal Grandfather   . Ovarian cancer Paternal Grandmother 66       Dec from ovarian ca  . Cancer Paternal Grandmother        uterine  . Diabetes Paternal Grandmother   . Hypertension Paternal Grandfather   . Kidney cancer Paternal Grandfather     Allergies  Allergen Reactions  . Wellbutrin [Bupropion] Nausea And Vomiting    Current Medications:   Current Outpatient Medications:  .  albuterol (VENTOLIN HFA) 108 (90 Base) MCG/ACT inhaler, Inhale 1 puff into the lungs every 6 (six) hours as needed for wheezing or shortness of breath., Disp: 18 g, Rfl: 3 .  amphetamine-dextroamphetamine (ADDERALL) 20 MG tablet, Take 1 tablet (20 mg total) by mouth 2 (two) times daily., Disp: 60 tablet, Rfl: 0 .  diazepam (VALIUM) 5 MG tablet, Take one tablet PO one hour prior to procedure., Disp: 2 tablet, Rfl: 0 .  Erenumab-aooe (AIMOVIG) 140 MG/ML SOAJ, Inject 140 mg into the skin every 28 (twenty-eight) days., Disp: 1 pen, Rfl: 5 .  hydrOXYzine (ATARAX/VISTARIL) 25 MG tablet, Take 1 or 2 pills 3 times daily as needed for itching., Disp: 40 tablet, Rfl: 0 .  misoprostol (CYTOTEC) 200 MCG tablet, Pace 1 tablet vaginally  the night before procedure and then place 1 tablet vaginally the morning of the procedure., Disp: 2 tablet, Rfl: 0 .  naproxen (NAPROSYN) 500 MG tablet, Take by mouth as needed. , Disp: , Rfl:  .  ondansetron (ZOFRAN ODT) 4 MG disintegrating tablet, Take 1 every 6 hours as needed for nausea, Disp: 20 tablet, Rfl: 0 .  SUMAtriptan (TOSYMRA) 10 MG/ACT SOLN, Place 1 spray into the nose as needed (May repeat every 1 hour as needed.  Maximum 3 sprays in 24 hours)., Disp: 9 each, Rfl: 11 .  valACYclovir (VALTREX) 500 MG tablet, Take 1 tablet (500 mg total) by mouth 2 (two) times daily. Take for three days.  Repeat course at first sign of a new outbreak., Disp: 30 tablet, Rfl: 3   Review of Systems:   ROS Negative unless otherwise specified per HPI.  Vitals:    Vitals:   08/25/20 1139  BP: 102/70  Pulse: 90  Temp: 98.2 F (36.8 C)  TempSrc: Temporal  SpO2: 96%  Weight: 93 lb 4 oz (42.3 kg)  Height: 5\' 3"  (1.6 m)     Body mass index is 16.52 kg/m.  Physical Exam:   Physical Exam Vitals and nursing note reviewed.  Constitutional:      General: She is not in acute distress.    Appearance: She is well-developed. She is not ill-appearing or toxic-appearing.  Cardiovascular:     Rate and Rhythm: Normal rate and regular rhythm.     Pulses: Normal pulses.     Heart sounds: Normal heart sounds, S1 normal and S2 normal.     Comments: No LE edema Pulmonary:     Effort: Pulmonary effort is normal.     Breath sounds: Normal breath sounds.  Chest:     Chest wall: No mass or tenderness.  Skin:    General: Skin is warm and dry.  Neurological:     General: No focal deficit present.     Mental Status: She is alert.     GCS: GCS eye subscore is 4. GCS verbal subscore is 5. GCS motor subscore is 6.     Cranial Nerves: Cranial nerves are intact.     Sensory: Sensation is intact.     Motor: Motor function is intact.     Coordination: Coordination is intact.  Psychiatric:        Speech: Speech normal.        Behavior: Behavior normal. Behavior is cooperative.      Assessment and Plan:   Thursa was seen today for dizziness and chest pain.  Diagnoses and all orders for this visit:  Chest pain on breathing -     D-Dimer, Quantitative; Future -     EKG 12-Lead -     D-Dimer, Quantitative   EKG tracing is personally reviewed.  EKG notes NSR.  No acute changes.  No red flags on exam. Will order D-Dimer, all other testing done and negative at [facility]. DDx includes -- costochondritis, asthma, PE, anxiety, among others. Agree with proceeding with cardiology referral. If symptoms worsen, recommended ER evaluation.  CMA or LPN served as scribe during this visit. History, Physical, and Plan performed by medical provider. The above  documentation has been reviewed and is accurate and complete.  Time spent with patient today was 25 minutes which consisted of chart review, discussing diagnosis, work up, treatment answering questions and documentation.   [facility], PA-C

## 2020-08-25 NOTE — Patient Instructions (Signed)
It was great to see you!  We repeated your EKG today. It looks stable overall.  We are getting a stat D-Dimer today.  If symptoms worsen, please go to the ER.  Take care,  Jarold Motto PA-C

## 2020-09-05 ENCOUNTER — Telehealth: Payer: Self-pay

## 2020-09-05 NOTE — Telephone Encounter (Signed)
Left message to call Brynlyn Dade, CMA. °

## 2020-09-05 NOTE — Telephone Encounter (Signed)
Called patient to schedule IUD insertion for Palau or Mirena. Patient has specific days and time frames she can come to office. Unable to find appointment for insertion during times she is requesting.  Advised may have to wait until next month. But will route to provider for further suggestions.  Dr.Silva okay to wait until next month?

## 2020-09-05 NOTE — Telephone Encounter (Signed)
Patient's period started today and she's calling to schedule iud insertion.

## 2020-09-05 NOTE — Progress Notes (Signed)
GYNECOLOGY  VISIT   HPI: 24 y.o.   Single  Caucasian  female   [id number] with Patient's last menstrual period was 09/05/2020.   here for IUD insertion--Kyleena or Mirena.    UPT: negative Patient has 800mg  of Ibuprofen at 8:00am  She used Cytotec last night and this am.  Hx endometriosis.  Neg GC/CT/trich on 12/14/19. New partner and used condom.  Patient is using a heart monitor for presyncopal episodes.   Going to 12/16/19 for [place].  GYNECOLOGIC HISTORY: Patient's last menstrual period was 09/05/2020. Contraception: Condoms Menopausal hormone therapy: n/a Last mammogram:  n/a Last pap smear: 12-14-19 Neg:Neg HR HPV, 2016 normal per patient        OB History    Gravida  0   Para  0   Term  0   Preterm  0   AB  0   Living  0     SAB  0   TAB  0   Ectopic  0   Multiple  0   Live Births  0              Patient Active Problem List   Diagnosis Date Noted  . Migraine with aura, intractable 03/09/2019  . Dysmenorrhea 05/18/2016  . Attention deficit hyperactivity disorder (ADHD), predominantly inattentive type 09/26/2014  . Cyst of right ovary 04/21/2014  . Asthma 10/28/2010  . GAD (generalized anxiety disorder) 10/28/2008    Past Medical History:  Diagnosis Date  . ADHD   . Anxiety   . Asthma   . Bilateral ovarian cysts   . Depression   . Dysmenorrhea   . Endometriosis   . History of chicken pox   . History of shingles 2009  . HSV-1 infection   . Migraines    with aura  . Ovarian cyst     Past Surgical History:  Procedure Laterality Date  . APPENDECTOMY    . PELVIC LAPAROSCOPY  03/18/2012   laparoscopic exicision of peritoneal lesion (endosalpingosis), indicental appendectomy    Current Outpatient Medications  Medication Sig Dispense Refill  . albuterol (VENTOLIN HFA) 108 (90 Base) MCG/ACT inhaler Inhale 1 puff into the lungs every 6 (six) hours as needed for wheezing or shortness of breath. 18 g 3  .  amphetamine-dextroamphetamine (ADDERALL) 20 MG tablet Take 1 tablet (20 mg total) by mouth 2 (two) times daily. 60 tablet 0  . Erenumab-aooe (AIMOVIG) 140 MG/ML SOAJ Inject 140 mg into the skin every 28 (twenty-eight) days. 1 pen 5  . hydrOXYzine (ATARAX/VISTARIL) 25 MG tablet Take 1 or 2 pills 3 times daily as needed for itching. 40 tablet 0  . naproxen (NAPROSYN) 500 MG tablet Take by mouth as needed.     . ondansetron (ZOFRAN ODT) 4 MG disintegrating tablet Take 1 every 6 hours as needed for nausea 20 tablet 0  . SUMAtriptan (TOSYMRA) 10 MG/ACT SOLN Place 1 spray into the nose as needed (May repeat every 1 hour as needed.  Maximum 3 sprays in 24 hours). 9 each 11  . valACYclovir (VALTREX) 500 MG tablet Take 1 tablet (500 mg total) by mouth 2 (two) times daily. Take for three days.  Repeat course at first sign of a new outbreak. 30 tablet 3  . diazepam (VALIUM) 5 MG tablet Take one tablet PO one hour prior to procedure. (Patient not taking: Reported on 09/07/2020) 2 tablet 0   No current facility-administered medications for this visit.     ALLERGIES: Wellbutrin [bupropion]  Family  History  Problem Relation Age of Onset  . Hypertension Father   . Breast cancer Maternal Grandmother   . Hypertension Maternal Grandfather   . Ovarian cancer Paternal Grandmother 40       Dec from ovarian ca  . Cancer Paternal Grandmother        uterine  . Diabetes Paternal Grandmother   . Hypertension Paternal Grandfather   . Kidney cancer Paternal Grandfather     Social History   Socioeconomic History  . Marital status: Single    Spouse name: Not on file  . Number of children: Not on file  . Years of education: Not on file  . Highest education level: Not on file  Occupational History  . Not on file  Tobacco Use  . Smoking status: Never Smoker  . Smokeless tobacco: Never Used  Vaping Use  . Vaping Use: Never used  Substance and Sexual Activity  . Alcohol use: Yes    Alcohol/week: 1.0  standard drink    Types: 1 Glasses of wine per week  . Drug use: No  . Sexual activity: Yes    Birth control/protection: Condom  Other Topics Concern  . Not on file  Social History Narrative  . Not on file   Social Determinants of Health   Financial Resource Strain:   . Difficulty of Paying Living Expenses: Not on file  Food Insecurity:   . Worried About Programme researcher, broadcasting/film/video in the Last Year: Not on file  . Ran Out of Food in the Last Year: Not on file  Transportation Needs:   . Lack of Transportation (Medical): Not on file  . Lack of Transportation (Non-Medical): Not on file  Physical Activity:   . Days of Exercise per Week: Not on file  . Minutes of Exercise per Session: Not on file  Stress:   . Feeling of Stress : Not on file  Social Connections:   . Frequency of Communication with Friends and Family: Not on file  . Frequency of Social Gatherings with Friends and Family: Not on file  . Attends Religious Services: Not on file  . Active Member of Clubs or Organizations: Not on file  . Attends Banker Meetings: Not on file  . Marital Status: Not on file  Intimate Partner Violence:   . Fear of Current or Ex-Partner: Not on file  . Emotionally Abused: Not on file  . Physically Abused: Not on file  . Sexually Abused: Not on file    Review of Systems  Constitutional: Negative.   HENT: Negative.   Eyes: Negative.   Respiratory: Negative.   Cardiovascular: Negative.   Gastrointestinal: Negative.   Endocrine: Negative.   Genitourinary: Negative.   Musculoskeletal: Negative.   Skin: Negative.   Allergic/Immunologic: Negative.   Neurological: Negative.   Hematological: Negative.   Psychiatric/Behavioral: Negative.     PHYSICAL EXAMINATION:    BP 110/60 (BP Location: Right Arm, Patient Position: Sitting, Cuff Size: Normal)   Pulse 88   Resp 16   Ht 5\' 3"  (1.6 m)   Wt 96 lb 3.2 oz (43.6 kg)   LMP 09/05/2020   BMI 17.04 kg/m     General appearance:  alert, cooperative and appears stated age    Pelvic: External genitalia:  no lesions              Urethra:  normal appearing urethra with no masses, tenderness or lesions  Bartholins and Skenes: normal                 Vagina: normal appearing vagina with normal color and discharge, no lesions              Cervix: no lesions                Bimanual Exam:  Uterus:  normal size, contour, position, consistency, mobility, non-tender              Adnexa: no mass, fullness, tenderness    Kyleena IUD insertion. Lot [id number], exp Nov 2023.  Consent for procedure. Hibiclens. Paracervical block with 10 cc 1% lidocaine, lot     [id number], exp 5/24. Tenaculum to anterior cervical lip.  Uterus sounded to just over 6 cm.  Kyleena IUD placed without difficulty.  Strings trimmed.  No complications.  Minimal EBL.   Chaperone was present for exam.  ASSESSMENT  Kyleena IUD placement.  STD screening.    PLAN  Post IUD instructions given.  Back up protection for one week.  Ibuprofen or Aleve for pain control.  Heating pad helpful.  GC/CT/trich testing.  FU in 4 weeks.

## 2020-09-05 NOTE — Telephone Encounter (Signed)
Spoke with pt. Pt states needing to schedule IUD insertion. Pt states started cycle today 09/05/20. Pt advised ok to schedule IUD insertion.  IUD insertion scheduled for 11/11 at 930 am. Pt agreeable to date and time of appt. Pt advised to take Motrin 800 mg with food and water one hour before procedure. Pt agreeable.  Pt states has driver and will use Rx Valium before procedure. Pt also to use Cytotec Rx. Pt verbalized understanding.  Pt has already signed consents for both Palau and Mirena. Consents on nurse's desk.  Encounter closed Cc: Hayley for precert. Orders placed.

## 2020-09-05 NOTE — Telephone Encounter (Signed)
Patient is calling back requesting the Thursday appointment.

## 2020-09-05 NOTE — Telephone Encounter (Signed)
Patient is returning call.  °

## 2020-09-07 ENCOUNTER — Other Ambulatory Visit: Payer: Self-pay

## 2020-09-07 ENCOUNTER — Other Ambulatory Visit (HOSPITAL_COMMUNITY)
Admission: RE | Admit: 2020-09-07 | Discharge: 2020-09-07 | Disposition: A | Discharge status: Home / Self Care | Payer: PRIVATE HEALTH INSURANCE | Source: Ambulatory Visit | Attending: Obstetrics and Gynecology | Admitting: Obstetrics and Gynecology

## 2020-09-07 ENCOUNTER — Encounter: Payer: Self-pay | Admitting: Obstetrics and Gynecology

## 2020-09-07 ENCOUNTER — Ambulatory Visit (INDEPENDENT_AMBULATORY_CARE_PROVIDER_SITE_OTHER): Payer: PRIVATE HEALTH INSURANCE | Admitting: Obstetrics and Gynecology

## 2020-09-07 VITALS — BP 110/60 | HR 88 | Resp 16 | Ht 63.0 in | Wt 96.2 lb

## 2020-09-07 DIAGNOSIS — Z113 Encounter for screening for infections with a predominantly sexual mode of transmission: Secondary | ICD-10-CM | POA: Diagnosis present

## 2020-09-07 DIAGNOSIS — Z30430 Encounter for insertion of intrauterine contraceptive device: Secondary | ICD-10-CM | POA: Diagnosis not present

## 2020-09-07 DIAGNOSIS — Z01812 Encounter for preprocedural laboratory examination: Secondary | ICD-10-CM | POA: Diagnosis not present

## 2020-09-07 DIAGNOSIS — Z3009 Encounter for other general counseling and advice on contraception: Secondary | ICD-10-CM

## 2020-09-07 LAB — POCT URINE PREGNANCY: Preg Test, Ur: NEGATIVE

## 2020-09-07 NOTE — Patient Instructions (Signed)
Intrauterine Device Insertion, Care After  This sheet gives you information about how to care for yourself after your procedure. Your health care provider may also give you more specific instructions. If you have problems or questions, contact your health care provider. What can I expect after the procedure? After the procedure, it is common to have:  Cramps and pain in the abdomen.  Light bleeding (spotting) or heavier bleeding that is like your menstrual period. This may last for up to a few days.  Lower back pain.  Dizziness.  Headaches.  Nausea. Follow these instructions at home:  Before resuming sexual activity, check to make sure that you can feel the IUD string(s). You should be able to feel the end of the string(s) below the opening of your cervix. If your IUD string is in place, you may resume sexual activity. ? If you had a hormonal IUD inserted more than 7 days after your most recent period started, you will need to use a backup method of birth control for 7 days after IUD insertion. Ask your health care provider whether this applies to you.  Continue to check that the IUD is still in place by feeling for the string(s) after every menstrual period, or once a month.  Take over-the-counter and prescription medicines only as told by your health care provider.  Do not drive or use heavy machinery while taking prescription pain medicine.  Keep all follow-up visits as told by your health care provider. This is important. Contact a health care provider if:  You have bleeding that is heavier or lasts longer than a normal menstrual cycle.  You have a fever.  You have cramps or abdominal pain that get worse or do not get better with medicine.  You develop abdominal pain that is new or is not in the same area of earlier cramping and pain.  You feel lightheaded or weak.  You have abnormal or bad-smelling discharge from your vagina.  You have pain during sexual  activity.  You have any of the following problems with your IUD string(s): ? The string bothers or hurts you or your sexual partner. ? You cannot feel the string. ? The string has gotten longer.  You can feel the IUD in your vagina.  You think you may be pregnant, or you miss your menstrual period.  You think you may have an STI (sexually transmitted infection). Get help right away if:  You have flu-like symptoms.  You have a fever and chills.  You can feel that your IUD has slipped out of place. Summary  After the procedure, it is common to have cramps and pain in the abdomen. It is also common to have light bleeding (spotting) or heavier bleeding that is like your menstrual period.  Continue to check that the IUD is still in place by feeling for the string(s) after every menstrual period, or once a month.  Keep all follow-up visits as told by your health care provider. This is important.  Contact your health care provider if you have problems with your IUD string(s), such as the string getting longer or bothering you or your sexual partner. This information is not intended to replace advice given to you by your health care provider. Make sure you discuss any questions you have with your health care provider. Document Revised: 09/26/2017 Document Reviewed: 09/04/2016 Elsevier Patient Education  2020 [employer]  

## 2020-09-08 LAB — CERVICOVAGINAL ANCILLARY ONLY
Chlamydia: NEGATIVE
Comment: NEGATIVE
Comment: NEGATIVE
Comment: NORMAL
Neisseria Gonorrhea: NEGATIVE
Trichomonas: NEGATIVE

## 2020-10-03 ENCOUNTER — Ambulatory Visit: Payer: PRIVATE HEALTH INSURANCE | Admitting: Neurology

## 2020-10-06 ENCOUNTER — Ambulatory Visit: Payer: PRIVATE HEALTH INSURANCE | Admitting: Neurology

## 2020-10-10 NOTE — Progress Notes (Signed)
NEUROLOGY FOLLOW UP OFFICE NOTE  Hayley Jensen [id number]   Subjective:  Hayley Jensen is a 24 year old female with ADHD and anxietyand history of kidney stones twicewho follows up for migraine.  UPDATE: Increased Aimovig in July.  Frequency less but intensity worse.  Tried Nurtec for rescue.  Doesn't take rescue early.   Intensity:  severe Duration:  3 to 5 days (with Nurtec, severe headache lasts 12 to 24 hours).  She likes Nurtec better than Tosyra. Frequency:  2 to 3 times a month (10-15 headache days a month) Rescue therapy: Nurtec Analgesic frequency:  Current NSAIDS:Naproxen 500mg (endometriosis) Current analgesics:none Current triptans: none Current ergotamine:none Current anti-emetic:Zofran ODT8mg  Current muscle relaxants:none Current anti-anxiolytic:none Current sleep aide:none Current Antihypertensive medications:none Current Antidepressant medications:none Current Anticonvulsant medications:none Current anti-CGRP:Aimovig 140mg , Nurtec 75mg  Current Vitamins/Herbal/Supplements:none Current Antihistamines/Decongestants:none Other therapy:Ginger candy for nausea Hormone/birth control:none Other medications:AdderallPRN  Caffeine:Tea. Rarely coffee. No energy drinks. Cola only for headache. Diet:64 oz water daily. Skips meals. But grazes during day.  Exercise:walks Depression:no; Anxiety:yes. Currently in law school. Other pain:endometriosis Sleep hygiene:7 to 9 hours sleep a night.  HISTORY: She started having migraines at age 3 to 62. She had a visual aura with spinning or horizontal waves of rainbow colors in both eyes that impede vision, followed by nausea and vomiting. They have progressed over the years and since around 24 years old they have been lasting several days at a time. She still sometimes gets the visual aura. Severe bifrontal pressure pain. Sometimes right eye is pain worse  with eye movement. Neck and body stiffness. Nose burns and turns red. Ears ring and pop. Severe nausea. Sometimes vomiting. Some photophobia, phonophobia, osmophobia, sometimes altered sense of smell. No numbness or weakness. Triggers include change in weather but does not appear to be associated with food or menstrual cycle. No real relieving factors. They last 10 to 14 days. They occur once a month.   Eye exam has been unremarkable.     Past NSAIDS:ibuprofen Past analgesics:Fioricet; Tylenol; Excedrin Past abortive triptans:rizatriptan, sumatriptan 25mg , Tosymra NS Past abortive ergotamine:none Past muscle relaxants:none Past anti-emetic:none Past antihypertensive medications:Propranolol (dizziness, fatigue) Past antidepressant medications:Citalopram Past anticonvulsant medications:none Past anti-CGRP:none Past vitamins/Herbal/Supplements:none Past antihistamines/decongestants:none Other past therapies:Sleep; peppermint; some cola   Family history of headache:Mother (migraines); maternal grandfather (migraines with seizures)  PAST MEDICAL HISTORY: Past Medical History:  Diagnosis Date   ADHD    Anxiety    Asthma    Bilateral ovarian cysts    Depression    Dysmenorrhea    Endometriosis    History of chicken pox    History of shingles 2009   HSV-1 infection    Migraines    with aura   Ovarian cyst     MEDICATIONS: Current Outpatient Medications on File Prior to Visit  Medication Sig Dispense Refill   albuterol (VENTOLIN HFA) 108 (90 Base) MCG/ACT inhaler Inhale 1 puff into the lungs every 6 (six) hours as needed for wheezing or shortness of breath. 18 g 3   amphetamine-dextroamphetamine (ADDERALL) 20 MG tablet Take 1 tablet (20 mg total) by mouth 2 (two) times daily. 60 tablet 0   diazepam (VALIUM) 5 MG tablet Take one tablet PO one hour prior to procedure. (Patient not taking: Reported on 09/07/2020) 2 tablet 0    Erenumab-aooe (AIMOVIG) 140 MG/ML SOAJ Inject 140 mg into the skin every 28 (twenty-eight) days. 1 pen 5   hydrOXYzine (ATARAX/VISTARIL) 25 MG tablet Take 1 or 2 pills 3 times daily  as needed for itching. 40 tablet 0   naproxen (NAPROSYN) 500 MG tablet Take by mouth as needed.      ondansetron (ZOFRAN ODT) 4 MG disintegrating tablet Take 1 every 6 hours as needed for nausea 20 tablet 0   SUMAtriptan (TOSYMRA) 10 MG/ACT SOLN Place 1 spray into the nose as needed (May repeat every 1 hour as needed.  Maximum 3 sprays in 24 hours). 9 each 11   valACYclovir (VALTREX) 500 MG tablet Take 1 tablet (500 mg total) by mouth 2 (two) times daily. Take for three days.  Repeat course at first sign of a new outbreak. 30 tablet 3   No current facility-administered medications on file prior to visit.    ALLERGIES: Allergies  Allergen Reactions   Wellbutrin [Bupropion] Nausea And Vomiting    FAMILY HISTORY: Family History  Problem Relation Age of Onset   Hypertension Father    Breast cancer Maternal Grandmother    Hypertension Maternal Grandfather    Ovarian cancer Paternal Grandmother 49       Dec from ovarian ca   Cancer Paternal Grandmother        uterine   Diabetes Paternal Grandmother    Hypertension Paternal Grandfather    Kidney cancer Paternal Grandfather     SOCIAL HISTORY: Social History   Socioeconomic History   Marital status: Single    Spouse name: Not on file   Number of children: Not on file   Years of education: Not on file   Highest education level: Not on file  Occupational History   Not on file  Tobacco Use   Smoking status: Never Smoker   Smokeless tobacco: Never Used  Vaping Use   Vaping Use: Never used  Substance and Sexual Activity   Alcohol use: Yes    Alcohol/week: 1.0 standard drink    Types: 1 Glasses of wine per week   Drug use: No   Sexual activity: Yes    Birth control/protection: Condom  Other Topics Concern   Not on  file  Social History Narrative   Not on file   Social Determinants of Health   Financial Resource Strain: Not on file  Food Insecurity: Not on file  Transportation Needs: Not on file  Physical Activity: Not on file  Stress: Not on file  Social Connections: Not on file  Intimate Partner Violence: Not on file     Objective:  Blood pressure 105/70, pulse 82, height 5\' 3"  (1.6 m), weight 98 lb 3.2 oz (44.5 kg), SpO2 100 %. General: No acute distress.  Patient appears well-groomed.   Head:  Normocephalic/atraumatic Eyes:  Fundi examined but not visualized Neck: supple, no paraspinal tenderness, full range of motion Heart:  Regular rate and rhythm Lungs:  Clear to auscultation bilaterally Back: No paraspinal tenderness Neurological Exam: alert and oriented to person, place, and time. Attention span and concentration intact, recent and remote memory intact, fund of knowledge intact.  Speech fluent and not dysarthric, language intact.  CN II-XII intact. Bulk and tone normal, muscle strength 5/5 throughout.  Sensation to light touch, temperature and vibration intact.  Deep tendon reflexes 2+ throughout, toes downgoing.  Finger to nose and heel to shin testing intact.  Gait normal, Romberg negative.   Assessment/Plan:   Migraine with aura, without status migrainosus, not intractable  1.  Migraine prevention:  Start nortriptyline 10mg  at bedtime for one week, then 20mg  at bedtime.  Continue Aimovig 140mg  every 28 days 2.  Migraine rescue:  Sprix  NS.  Due to significant nausea and even vomiting, would try to avoid an oral tablet. Zofran for nausea. 3.  Limit use of pain relievers to no more than 2 days out of week to prevent risk of rebound or medication-overuse headache. 4.  Keep headache diary 5.  Follow up 6 months.  Shon Millet, DO  CC: Jarold Motto, PA

## 2020-10-11 ENCOUNTER — Ambulatory Visit (INDEPENDENT_AMBULATORY_CARE_PROVIDER_SITE_OTHER): Payer: PRIVATE HEALTH INSURANCE | Admitting: Neurology

## 2020-10-11 ENCOUNTER — Encounter: Payer: Self-pay | Admitting: Neurology

## 2020-10-11 ENCOUNTER — Other Ambulatory Visit: Payer: Self-pay

## 2020-10-11 VITALS — BP 105/70 | HR 82 | Ht 63.0 in | Wt 98.2 lb

## 2020-10-11 DIAGNOSIS — G43111 Migraine with aura, intractable, with status migrainosus: Secondary | ICD-10-CM | POA: Diagnosis not present

## 2020-10-11 MED ORDER — KETOROLAC TROMETHAMINE 15.75 MG/SPRAY NA SOLN: NASAL | 5 refills | Status: DC

## 2020-10-11 MED ORDER — NORTRIPTYLINE HCL 10 MG PO CAPS: ORAL_CAPSULE | ORAL | 5 refills | Status: DC

## 2020-10-11 NOTE — Patient Instructions (Signed)
  1. Start nortriptyline 10mg  at bedtime for one week, then increase to 20mg  at bedtime. If no improvement in headaches in 5 weeks, contact me and we can increase dose. 2. Continue Aimovig 140mg  every 28 days 3. Take Sprix nasal spray 1 spray in each nostril every 6 to 8 hours for total of 4 doses (1 spray in each nostril).  Do not exceed 5 days in 24 hours period. 4. Zofran for nausea 5. Limit use of pain relievers to no more than 2 days out of the week.  These medications include acetaminophen, NSAIDs (ibuprofen/Advil/Motrin, naproxen/Aleve, triptans (Imitrex/sumatriptan), Excedrin, and narcotics.  This will help reduce risk of rebound headaches. 6. Be aware of common food triggers:  - Caffeine:  coffee, black tea, cola, Mt. Dew  - Chocolate  - Dairy:  aged cheeses (brie, blue, cheddar, gouda, [place], provolone, [place], Swiss, etc), chocolate milk, buttermilk, sour cream, limit eggs and yogurt  - Nuts, peanut butter  - Alcohol  - Cereals/grains:  FRESH breads (fresh bagels, sourdough, doughnuts), yeast productions  - Processed/canned/aged/cured meats (pre-packaged deli meats, hotdogs)  - MSG/glutamate:  soy sauce, flavor enhancer, pickled/preserved/marinated foods  - Sweeteners:  aspartame (Equal, Nutrasweet).  Sugar and Splenda are okay  - Vegetables:  legumes (lima beans, lentils, snow peas, fava beans, pinto peans, peas, garbanzo beans), sauerkraut, onions, olives, pickles  - Fruit:  avocados, bananas, citrus fruit (orange, lemon, grapefruit), mango  - Other:  Frozen meals, macaroni and cheese 7. Routine exercise 8. Stay adequately hydrated (aim for 64 oz water daily) 9. Keep headache diary 10. Maintain proper stress management 11. Maintain proper sleep hygiene 12. Do not skip meals 13. Consider supplements:  magnesium citrate 400mg  daily, riboflavin 400mg  daily, coenzyme Q10 100mg  three times daily.

## 2020-10-12 NOTE — Progress Notes (Signed)
GYNECOLOGY  VISIT   HPI: 24 y.o.   Single  Caucasian  female   [id number] with Patient's last menstrual period was 10/10/2020.   here for IUD check. Patient is currently having spotting.      Spotting is coming and going.  May have had a period 3 weeks after placement, and the blood was old.  Now the blood is more fresh.  No cramping.   Sexually active and no problems with pain for patient or partner.   Has not checked her IUD.   Going home for Christmas.   GYNECOLOGIC HISTORY: Patient's last menstrual period was 10/10/2020. Contraception:  Kyleena IUD inserted 09/07/20 Menopausal hormone therapy:  n/a Last mammogram:  n/a Last pap smear:   12/14/19 Neg:Neg HR HPV, 2016 normal per patient        OB History    Gravida  0   Para  0   Term  0   Preterm  0   AB  0   Living  0     SAB  0   IAB  0   Ectopic  0   Multiple  0   Live Births  0              Patient Active Problem List   Diagnosis Date Noted  . Migraine with aura, intractable 03/09/2019  . Dysmenorrhea 05/18/2016  . Attention deficit hyperactivity disorder (ADHD), predominantly inattentive type 09/26/2014  . Cyst of right ovary 04/21/2014  . Asthma 10/28/2010  . GAD (generalized anxiety disorder) 10/28/2008    Past Medical History:  Diagnosis Date  . ADHD   . Anxiety   . Asthma   . Bilateral ovarian cysts   . Depression   . Dysmenorrhea   . Endometriosis   . History of chicken pox   . History of shingles 2009  . HSV-1 infection   . Migraines    with aura  . Ovarian cyst     Past Surgical History:  Procedure Laterality Date  . APPENDECTOMY    . PELVIC LAPAROSCOPY  03/18/2012   laparoscopic exicision of peritoneal lesion (endosalpingosis), indicental appendectomy    Current Outpatient Medications  Medication Sig Dispense Refill  . albuterol (VENTOLIN HFA) 108 (90 Base) MCG/ACT inhaler Inhale 1 puff into the lungs every 6 (six) hours as needed for wheezing or shortness of  breath. 18 g 3  . amphetamine-dextroamphetamine (ADDERALL) 20 MG tablet Take 1 tablet (20 mg total) by mouth 2 (two) times daily. 60 tablet 0  . diazepam (VALIUM) 5 MG tablet Take one tablet PO one hour prior to procedure. (Patient not taking: No sig reported) 2 tablet 0  . Erenumab-aooe (AIMOVIG) 140 MG/ML SOAJ Inject 140 mg into the skin every 28 (twenty-eight) days. 1 pen 5  . hydrOXYzine (ATARAX/VISTARIL) 25 MG tablet Take 1 or 2 pills 3 times daily as needed for itching. 40 tablet 0  . Ketorolac Tromethamine (SPRIX) 15.75 MG/SPRAY SOLN 1 spray in each nostril every 6 to 8 hours x 4.  Maximum 4 doses (1 spray each nostril) in 24 hours.  Do not exceed more than 5 days in 30 day period. 5 each 5  . naproxen (NAPROSYN) 500 MG tablet Take by mouth as needed.     . nortriptyline (PAMELOR) 10 MG capsule Take 1 capsule at bedtime for a week, then increase to 2 capsules at bedtime 60 capsule 5  . ondansetron (ZOFRAN ODT) 4 MG disintegrating tablet Take 1 every 6 hours as needed  for nausea 20 tablet 0  . valACYclovir (VALTREX) 500 MG tablet Take 1 tablet (500 mg total) by mouth 2 (two) times daily. Take for three days.  Repeat course at first sign of a new outbreak. 30 tablet 3   No current facility-administered medications for this visit.     ALLERGIES: Wellbutrin [bupropion]  Family History  Problem Relation Age of Onset  . Hypertension Father   . Breast cancer Maternal Grandmother   . Hypertension Maternal Grandfather   . Ovarian cancer Paternal Grandmother 35       Dec from ovarian ca  . Cancer Paternal Grandmother        uterine  . Diabetes Paternal Grandmother   . Hypertension Paternal Grandfather   . Kidney cancer Paternal Grandfather     Social History   Socioeconomic History  . Marital status: Single    Spouse name: Not on file  . Number of children: Not on file  . Years of education: Not on file  . Highest education level: Not on file  Occupational History  . Not on file   Tobacco Use  . Smoking status: Never Smoker  . Smokeless tobacco: Never Used  Vaping Use  . Vaping Use: Never used  Substance and Sexual Activity  . Alcohol use: Yes    Alcohol/week: 1.0 standard drink    Types: 1 Glasses of wine per week  . Drug use: No  . Sexual activity: Yes    Birth control/protection: Condom  Other Topics Concern  . Not on file  Social History Narrative  . Not on file   Social Determinants of Health   Financial Resource Strain: Not on file  Food Insecurity: Not on file  Transportation Needs: Not on file  Physical Activity: Not on file  Stress: Not on file  Social Connections: Not on file  Intimate Partner Violence: Not on file    Review of Systems  Constitutional: Negative.   HENT: Negative.   Eyes: Negative.   Respiratory: Negative.   Cardiovascular: Negative.   Gastrointestinal: Negative.   Endocrine: Negative.   Genitourinary: Negative.   Musculoskeletal: Negative.   Skin: Negative.   Allergic/Immunologic: Negative.   Neurological: Negative.   Hematological: Negative.   Psychiatric/Behavioral: Negative.     PHYSICAL EXAMINATION:    BP 108/60 (BP Location: Right Arm, Patient Position: Sitting, Cuff Size: Normal)   Pulse 64   Ht 5\' 3"  (1.6 m)   Wt 98 lb (44.5 kg)   LMP 10/10/2020 Comment: spotting  BMI 17.36 kg/m     General appearance: alert, cooperative and appears stated age   Pelvic: External genitalia:  no lesions              Urethra:  normal appearing urethra with no masses, tenderness or lesions              Bartholins and Skenes: normal                 Vagina: normal appearing vagina with normal color and discharge, no lesions              Cervix: no lesions.  IUD strings noted.   Mild bloody mucousy discharge.                Bimanual Exam:  Uterus:  normal size, contour, position, consistency, mobility, non-tender              Adnexa: no mass, fullness, tenderness  Chaperone was present for  exam.  ASSESSMENT  Kyleena IUD check up.  Doing well.   PLAN  Reassurance regarding IUD position and prevention of pregnancy.  Bleeding profiles with Rutha Bouchard IUD discussed.  Reminder of 5 year use.  Follow up for annual exam in February, 2022 and prn.   15  total time was spent for this patient encounter, including preparation, face-to-face counseling with the patient, coordination of care, and documentation of the encounter.

## 2020-10-13 ENCOUNTER — Encounter: Payer: Self-pay | Admitting: Obstetrics and Gynecology

## 2020-10-13 ENCOUNTER — Other Ambulatory Visit: Payer: Self-pay

## 2020-10-13 ENCOUNTER — Ambulatory Visit (INDEPENDENT_AMBULATORY_CARE_PROVIDER_SITE_OTHER): Payer: PRIVATE HEALTH INSURANCE | Admitting: Obstetrics and Gynecology

## 2020-10-13 VITALS — BP 108/60 | HR 64 | Ht 63.0 in | Wt 98.0 lb

## 2020-10-13 DIAGNOSIS — Z30431 Encounter for routine checking of intrauterine contraceptive device: Secondary | ICD-10-CM | POA: Diagnosis not present

## 2020-10-17 ENCOUNTER — Encounter: Payer: Self-pay | Admitting: Neurology

## 2020-10-17 NOTE — Progress Notes (Signed)
12/20- sent appeal through fax for the Ketorolac appeal to ins. Tracking ID#: [id number].

## 2020-10-17 NOTE — Progress Notes (Signed)
Bobi Epling Key: [id number] - PA Case ID: [id number] - Rx #: [id number] Need help? Call us at [phone] Outcome Deniedon December 17 Your request has been denied Drug Ketorolac Tromethamine 15.75MG [place] solution Form [employer] Prior Authorization Form ([employer]) 2017 NCPDP Original Claim Info 70,MR NON-FORMULARY DRUG, CONTACT PRESCRIBER.

## 2020-11-03 ENCOUNTER — Encounter: Payer: Self-pay | Admitting: Physician Assistant

## 2020-11-03 ENCOUNTER — Other Ambulatory Visit: Payer: Self-pay

## 2020-11-03 ENCOUNTER — Ambulatory Visit (INDEPENDENT_AMBULATORY_CARE_PROVIDER_SITE_OTHER): Payer: No Typology Code available for payment source | Admitting: Physician Assistant

## 2020-11-03 VITALS — BP 90/60 | HR 68 | Temp 98.0°F | Ht 63.0 in | Wt 100.0 lb

## 2020-11-03 DIAGNOSIS — F900 Attention-deficit hyperactivity disorder, predominantly inattentive type: Secondary | ICD-10-CM | POA: Diagnosis not present

## 2020-11-03 DIAGNOSIS — B009 Herpesviral infection, unspecified: Secondary | ICD-10-CM

## 2020-11-03 MED ORDER — AMPHETAMINE-DEXTROAMPHETAMINE 20 MG PO TABS: 20.0000 mg | ORAL_TABLET | Freq: Two times a day (BID) | ORAL | 0 refills | Status: DC

## 2020-11-03 MED ORDER — VALACYCLOVIR HCL 500 MG PO TABS: 500.0000 mg | ORAL_TABLET | Freq: Two times a day (BID) | ORAL | 3 refills | Status: AC

## 2020-11-03 NOTE — Progress Notes (Signed)
Hayley Jensen is a 25 y.o. female is here for follow up.  I acted as a Neurosurgeon for [employer], PA-C Corky Mull, LPN   History of Present Illness:   Chief Complaint  Patient presents with  . ADHD    HPI   ADHD Pt here for follow up, currently taking Adderall 20 mg BID as needed. Medication is working well pt is able to focus, less moody and able to sleep. Pt tolerating well no side effects. Denies: chest pain, palpitations, insomnia, worsening HA.   HSV-1 Needs refill on valtrex. Has not had recent outbreaks but current rx is out of date. Medication is reportedly effective when she does take it.    There are no preventive care reminders to display for this patient.  Past Medical History:  Diagnosis Date  . ADHD   . Anxiety   . Asthma   . Bilateral ovarian cysts   . Depression   . Dysmenorrhea   . Endometriosis   . History of chicken pox   . History of shingles 2009  . HSV-1 infection   . Migraines    with aura  . Ovarian cyst      Social History   Tobacco Use  . Smoking status: Never Smoker  . Smokeless tobacco: Never Used  Vaping Use  . Vaping Use: Never used  Substance Use Topics  . Alcohol use: Yes    Alcohol/week: 1.0 standard drink    Types: 1 Glasses of wine per week  . Drug use: No    Past Surgical History:  Procedure Laterality Date  . APPENDECTOMY    . PELVIC LAPAROSCOPY  03/18/2012   laparoscopic exicision of peritoneal lesion (endosalpingosis), indicental appendectomy    Family History  Problem Relation Age of Onset  . Hypertension Father   . Breast cancer Maternal Grandmother   . Hypertension Maternal Grandfather   . Ovarian cancer Paternal Grandmother 71       Dec from ovarian ca  . Cancer Paternal Grandmother        uterine  . Diabetes Paternal Grandmother   . Hypertension Paternal Grandfather   . Kidney cancer Paternal Grandfather     PMHx, SurgHx, SocialHx, FamHx, Medications, and Allergies were reviewed in the  Visit Navigator and updated as appropriate.   Patient Active Problem List   Diagnosis Date Noted  . Migraine with aura, intractable 03/09/2019  . Dysmenorrhea 05/18/2016  . Attention deficit hyperactivity disorder (ADHD), predominantly inattentive type 09/26/2014  . Cyst of right ovary 04/21/2014  . Asthma 10/28/2010  . GAD (generalized anxiety disorder) 10/28/2008    Social History   Tobacco Use  . Smoking status: Never Smoker  . Smokeless tobacco: Never Used  Vaping Use  . Vaping Use: Never used  Substance Use Topics  . Alcohol use: Yes    Alcohol/week: 1.0 standard drink    Types: 1 Glasses of wine per week  . Drug use: No    Current Medications and Allergies:    Current Outpatient Medications:  .  albuterol (VENTOLIN HFA) 108 (90 Base) MCG/ACT inhaler, Inhale 1 puff into the lungs every 6 (six) hours as needed for wheezing or shortness of breath., Disp: 18 g, Rfl: 3 .  amphetamine-dextroamphetamine (ADDERALL) 20 MG tablet, Take 1 tablet (20 mg total) by mouth 2 (two) times daily., Disp: 60 tablet, Rfl: 0 .  amphetamine-dextroamphetamine (ADDERALL) 20 MG tablet, Take 1 tablet (20 mg total) by mouth 2 (two) times daily., Disp: 60 tablet, Rfl:  0 .  [START ON 12/03/2020] amphetamine-dextroamphetamine (ADDERALL) 20 MG tablet, Take 1 tablet (20 mg total) by mouth 2 (two) times daily., Disp: 60 tablet, Rfl: 0 .  [START ON 01/02/2021] amphetamine-dextroamphetamine (ADDERALL) 20 MG tablet, Take 1 tablet (20 mg total) by mouth 2 (two) times daily., Disp: 60 tablet, Rfl: 0 .  Erenumab-aooe (AIMOVIG) 140 MG/ML SOAJ, Inject 140 mg into the skin every 28 (twenty-eight) days., Disp: 1 pen, Rfl: 5 .  hydrOXYzine (ATARAX/VISTARIL) 25 MG tablet, Take 1 or 2 pills 3 times daily as needed for itching., Disp: 40 tablet, Rfl: 0 .  levonorgestrel (KYLEENA) 19.5 MG IUD, by Intrauterine route once. Inserted in November 2021, needs to be removed 08/2025, Disp: , Rfl:  .  naproxen (NAPROSYN) 500 MG  tablet, Take by mouth as needed. , Disp: , Rfl:  .  ondansetron (ZOFRAN ODT) 4 MG disintegrating tablet, Take 1 every 6 hours as needed for nausea, Disp: 20 tablet, Rfl: 0 .  Ketorolac Tromethamine (SPRIX) 15.75 MG/SPRAY SOLN, 1 spray in each nostril every 6 to 8 hours x 4.  Maximum 4 doses (1 spray each nostril) in 24 hours.  Do not exceed more than 5 days in 30 day period. (Patient not taking: Reported on 11/03/2020), Disp: 5 each, Rfl: 5 .  valACYclovir (VALTREX) 500 MG tablet, Take 1 tablet (500 mg total) by mouth 2 (two) times daily. Take for three days.  Repeat course at first sign of a new outbreak., Disp: 30 tablet, Rfl: 3   Allergies  Allergen Reactions  . Wellbutrin [Bupropion] Nausea And Vomiting    Review of Systems   ROS  Negative unless otherwise specified per HPI.  Vitals:   Vitals:   11/03/20 0808  BP: 90/60  Pulse: 68  Temp: 98 F (36.7 C)  TempSrc: Temporal  Weight: 100 lb (45.4 kg)  Height: 5\' 3"  (1.6 m)     Body mass index is 17.71 kg/m.   Physical Exam:    Physical Exam Vitals and nursing note reviewed.  Constitutional:      General: She is not in acute distress.    Appearance: She is well-developed. She is not ill-appearing, toxic-appearing or sickly-appearing.  Cardiovascular:     Rate and Rhythm: Normal rate and regular rhythm.     Pulses: Normal pulses.     Heart sounds: Normal heart sounds, S1 normal and S2 normal.     Comments: No LE edema Pulmonary:     Effort: Pulmonary effort is normal.     Breath sounds: Normal breath sounds.  Skin:    General: Skin is warm, dry and intact.  Neurological:     Mental Status: She is alert.     GCS: GCS eye subscore is 4. GCS verbal subscore is 5. GCS motor subscore is 6.  Psychiatric:        Mood and Affect: Mood and affect normal.        Speech: Speech normal.        Behavior: Behavior normal. Behavior is cooperative.      Assessment and Plan:    Hayley Jensen was seen today for adhd.  Diagnoses and  all orders for this visit:  Attention deficit hyperactivity disorder (ADHD), predominantly inattentive type PDMP reviewed.  No red flags on discussion. Refilled adderall 20 mg BID. Follow-up in 6 months, sooner if concerns.  HSV-1 (herpes simplex virus 1) infection Well controlled. Refill valtrex for prn use. Follow-up as needed. -     valACYclovir (VALTREX) 500 MG  tablet; Take 1 tablet (500 mg total) by mouth 2 (two) times daily. Take for three days.  Repeat course at first sign of a new outbreak.  Other orders -     amphetamine-dextroamphetamine (ADDERALL) 20 MG tablet; Take 1 tablet (20 mg total) by mouth 2 (two) times daily. -     amphetamine-dextroamphetamine (ADDERALL) 20 MG tablet; Take 1 tablet (20 mg total) by mouth 2 (two) times daily. -     amphetamine-dextroamphetamine (ADDERALL) 20 MG tablet; Take 1 tablet (20 mg total) by mouth 2 (two) times daily.  CMA or LPN served as scribe during this visit. History, Physical, and Plan performed by medical provider. The above documentation has been reviewed and is accurate and complete.  Jarold Motto, PA-C [place] [place] 11/03/2020  Follow-up: No follow-ups on file.

## 2020-11-03 NOTE — Patient Instructions (Signed)
It was great to see you!  Refills sent in.  Send me a mychart message when you need your next fill.  Let's follow-up in 6-9 months, sooner if you have concerns.  Take care,  Jarold Motto PA-C

## 2020-11-20 ENCOUNTER — Other Ambulatory Visit: Payer: Self-pay | Admitting: Neurology

## 2020-11-21 ENCOUNTER — Encounter: Payer: Self-pay | Admitting: Neurology

## 2020-11-21 NOTE — Progress Notes (Signed)
Hayley Jensen (Key: [id number]) Rx #: [id number] Aimovig 140MG /ML auto-injectors   Form [employer] [facility] Electronic Prior Authorization Form ([employer]) 2017 NCPDP Created 16 hours ago Sent to Plan 10 minutes ago Plan Response 9 minutes ago Submit Clinical Questions 7 minutes ago Determination Favorable 3 minutes ago Message from Plan Approved. This drug has been approved. Approved quantity: 1 units per 28 day(s). The drug has been approved from 11/07/2020 to 05/20/2021. Please call the pharmacy to process your prescription claim.

## 2020-11-23 NOTE — Progress Notes (Signed)
Tried multiple numbers over multiple days for insurance to get info on Appeal and couldn't get any info. Decided to call pharmacy to see if they could see if PA approved. Spoke with Tech that stated there is a valid PA on file for her but even with PA the medication is coming out to 900$ for the medication through insurance.

## 2020-11-29 ENCOUNTER — Telehealth (INDEPENDENT_AMBULATORY_CARE_PROVIDER_SITE_OTHER): Payer: No Typology Code available for payment source | Admitting: Physician Assistant

## 2020-11-29 ENCOUNTER — Encounter: Payer: Self-pay | Admitting: Physician Assistant

## 2020-11-29 VITALS — Temp 99.8°F | Ht 63.0 in | Wt 97.0 lb

## 2020-11-29 DIAGNOSIS — R6889 Other general symptoms and signs: Secondary | ICD-10-CM

## 2020-11-29 DIAGNOSIS — R5383 Other fatigue: Secondary | ICD-10-CM

## 2020-11-29 DIAGNOSIS — R509 Fever, unspecified: Secondary | ICD-10-CM | POA: Diagnosis not present

## 2020-11-29 DIAGNOSIS — R110 Nausea: Secondary | ICD-10-CM

## 2020-11-29 DIAGNOSIS — M791 Myalgia, unspecified site: Secondary | ICD-10-CM | POA: Diagnosis not present

## 2020-11-29 DIAGNOSIS — J029 Acute pharyngitis, unspecified: Secondary | ICD-10-CM | POA: Diagnosis not present

## 2020-11-29 DIAGNOSIS — R519 Headache, unspecified: Secondary | ICD-10-CM

## 2020-11-29 DIAGNOSIS — R197 Diarrhea, unspecified: Secondary | ICD-10-CM

## 2020-11-29 LAB — POCT INFLUENZA A/B
Influenza A, POC: NEGATIVE
Influenza B, POC: NEGATIVE

## 2020-11-29 LAB — POCT RAPID STREP A (OFFICE): Rapid Strep A Screen: NEGATIVE

## 2020-11-29 NOTE — Progress Notes (Signed)
Virtual Visit via Video   I connected with Hayley Jensen on 11/29/20 at 11:30 AM EST by a video enabled telemedicine application and verified that I am speaking with the correct person using two identifiers. Location patient: Home Location provider:  [facility], Office Persons participating in the virtual visit: Hayley Jensen, Jarold Motto PA-C, Hayley Mull, LPN   I discussed the limitations of evaluation and management by telemedicine and the availability of in person appointments. The patient expressed understanding and agreed to proceed.  I acted as a Neurosurgeon for [employer], PA-C [employer], LPN    Subjective:   HPI:   Patient is requesting evaluation for possible COVID-19.  Symptom onset: Sunday  Travel/contacts: No  Vaccination status: Complete  Testing results: Home COVID test this AM Neg  Patient endorses the following symptoms: Body aches, fatigue, fever and chills, nausea, diarrhea, headache, sore throat. Pt was treated for strep was on Amoxicillin last day treated was 11/06/2020.  Patient denies the following symptoms: SOB, chest pain, chest tightness  Treatments tried: Tylenol  Patient risk factors: Current COVID-19 risk of complications score: 1 Smoking status: Hayley Jensen  reports that she has never smoked. She has never used smokeless tobacco. If female, currently pregnant? []   Yes [x]   No  ROS: See pertinent positives and negatives per HPI.  Patient Active Problem List   Diagnosis Date Noted  . Migraine with aura, intractable 03/09/2019  . Dysmenorrhea 05/18/2016  . Attention deficit hyperactivity disorder (ADHD), predominantly inattentive type 09/26/2014  . Cyst of right ovary 04/21/2014  . Asthma 10/28/2010  . GAD (generalized anxiety disorder) 10/28/2008    Social History   Tobacco Use  . Smoking status: Never Smoker  . Smokeless tobacco: Never Used  Substance Use Topics  . Alcohol use: Yes    Alcohol/week: 1.0  standard drink    Types: 1 Glasses of wine per week    Current Outpatient Medications:  .  AIMOVIG 140 MG/ML SOAJ, INJECT 140MG  UNDER THE SKIN ONCE EVERY 28 DAYS, Disp: 1 mL, Rfl: 1 .  albuterol (VENTOLIN HFA) 108 (90 Base) MCG/ACT inhaler, Inhale 1 puff into the lungs every 6 (six) hours as needed for wheezing or shortness of breath., Disp: 18 g, Rfl: 3 .  amphetamine-dextroamphetamine (ADDERALL) 20 MG tablet, Take 1 tablet (20 mg total) by mouth 2 (two) times daily., Disp: 60 tablet, Rfl: 0 .  amphetamine-dextroamphetamine (ADDERALL) 20 MG tablet, Take 1 tablet (20 mg total) by mouth 2 (two) times daily., Disp: 60 tablet, Rfl: 0 .  [START ON 12/03/2020] amphetamine-dextroamphetamine (ADDERALL) 20 MG tablet, Take 1 tablet (20 mg total) by mouth 2 (two) times daily., Disp: 60 tablet, Rfl: 0 .  [START ON 01/02/2021] amphetamine-dextroamphetamine (ADDERALL) 20 MG tablet, Take 1 tablet (20 mg total) by mouth 2 (two) times daily., Disp: 60 tablet, Rfl: 0 .  hydrOXYzine (ATARAX/VISTARIL) 25 MG tablet, Take 1 or 2 pills 3 times daily as needed for itching., Disp: 40 tablet, Rfl: 0 .  levonorgestrel (KYLEENA) 19.5 MG IUD, by Intrauterine route once. Inserted in November 2021, needs to be removed 08/2025, Disp: , Rfl:  .  naproxen (NAPROSYN) 500 MG tablet, Take by mouth as needed. , Disp: , Rfl:  .  valACYclovir (VALTREX) 500 MG tablet, Take 1 tablet (500 mg total) by mouth 2 (two) times daily. Take for three days.  Repeat course at first sign of a new outbreak., Disp: 30 tablet, Rfl: 3 .  Ketorolac Tromethamine (SPRIX) 15.75 MG/SPRAY  SOLN, 1 spray in each nostril every 6 to 8 hours x 4.  Maximum 4 doses (1 spray each nostril) in 24 hours.  Do not exceed more than 5 days in 30 day period. (Patient not taking: No sig reported), Disp: 5 each, Rfl: 5 .  ondansetron (ZOFRAN ODT) 4 MG disintegrating tablet, Take 1 every 6 hours as needed for nausea (Patient not taking: Reported on 11/29/2020), Disp: 20 tablet, Rfl:  0  Allergies  Allergen Reactions  . Wellbutrin [Bupropion] Nausea And Vomiting    Objective:   VITALS: Per patient if applicable, see vitals. GENERAL: Alert, appears well and in no acute distress. HEENT: Atraumatic, conjunctiva clear, no obvious abnormalities on inspection of external nose and ears. NECK: Normal movements of the head and neck. CARDIOPULMONARY: No increased WOB. Speaking in clear sentences. I:E ratio WNL.  MS: Moves all visible extremities without noticeable abnormality. PSYCH: Pleasant and cooperative, well-groomed. Speech normal rate and rhythm. Affect is appropriate. Insight and judgement are appropriate. Attention is focused, linear, and appropriate.  NEURO: CN grossly intact. Oriented as arrived to appointment on time with no prompting. Moves both UE equally.  SKIN: No obvious lesions, wounds, erythema, or cyanosis noted on face or hands.  Results for orders placed or performed in visit on 11/29/20  POCT Influenza A/B  Result Value Ref Range   Influenza A, POC Negative Negative   Influenza B, POC Negative Negative  POCT rapid strep A  Result Value Ref Range   Rapid Strep A Screen Negative Negative    Assessment and Plan:   Jaime was seen today for covid symptoms.  Diagnoses and all orders for this visit:  Flu-like symptoms -     Novel Coronavirus, NAA ([facility]) -     POCT Influenza A/B -     POCT rapid strep A   No red flags on discussion, patient is not in any obvious distress during our visit. Discussed progression of most viral illnesses, and recommended supportive care at this point in time.  Flu and strep test POC done in parking lot were negative.  COVID PCR test pending. Discussed over the counter supportive care options, including Tylenol 500 mg q 8 hours, with recommendations to push fluids and rest. Reviewed return precautions including new/worsening fever, SOB, new/worsening cough, sudden onset changes of symptoms. Recommended need to  self-quarantine and practice social distancing until symptoms resolve. I recommend that patient follow-up if symptoms worsen or persist despite treatment x 7-10 days, sooner if needed.  I discussed the assessment and treatment plan with the patient. The patient was provided an opportunity to ask questions and all were answered. The patient agreed with the plan and demonstrated an understanding of the instructions.   The patient was advised to call back or seek an in-person evaluation if the symptoms worsen or if the condition fails to improve as anticipated.   CMA or LPN served as scribe during this visit. History, Physical, and Plan performed by medical provider. The above documentation has been reviewed and is accurate and complete.  [place], [place] 11/29/2020

## 2020-11-30 ENCOUNTER — Other Ambulatory Visit: Payer: Self-pay

## 2020-11-30 ENCOUNTER — Encounter: Payer: Self-pay | Admitting: Physician Assistant

## 2020-11-30 ENCOUNTER — Ambulatory Visit (HOSPITAL_COMMUNITY)
Admission: EM | Admit: 2020-11-30 | Discharge: 2020-11-30 | Disposition: A | Discharge status: Home / Self Care | Payer: PRIVATE HEALTH INSURANCE | Attending: Student | Admitting: Student

## 2020-11-30 ENCOUNTER — Telehealth: Payer: Self-pay

## 2020-11-30 ENCOUNTER — Encounter (HOSPITAL_COMMUNITY): Payer: Self-pay | Admitting: Emergency Medicine

## 2020-11-30 ENCOUNTER — Ambulatory Visit (INDEPENDENT_AMBULATORY_CARE_PROVIDER_SITE_OTHER): Payer: PRIVATE HEALTH INSURANCE

## 2020-11-30 DIAGNOSIS — R0602 Shortness of breath: Secondary | ICD-10-CM

## 2020-11-30 DIAGNOSIS — H66002 Acute suppurative otitis media without spontaneous rupture of ear drum, left ear: Secondary | ICD-10-CM | POA: Diagnosis not present

## 2020-11-30 DIAGNOSIS — J45901 Unspecified asthma with (acute) exacerbation: Secondary | ICD-10-CM | POA: Diagnosis not present

## 2020-11-30 DIAGNOSIS — J069 Acute upper respiratory infection, unspecified: Secondary | ICD-10-CM

## 2020-11-30 MED ORDER — METHYLPREDNISOLONE SODIUM SUCC 125 MG IJ SOLR
INTRAMUSCULAR | Status: AC
  Filled 2020-11-30: qty 2

## 2020-11-30 MED ORDER — METHYLPREDNISOLONE SODIUM SUCC 125 MG IJ SOLR
60.0000 mg | Freq: Once | INTRAMUSCULAR | Status: AC
  Administered 2020-11-30: 60 mg via INTRAMUSCULAR

## 2020-11-30 MED ORDER — PREDNISONE 20 MG PO TABS: 20.0000 mg | ORAL_TABLET | Freq: Every day | ORAL | 0 refills | Status: AC

## 2020-11-30 MED ORDER — AMOXICILLIN-POT CLAVULANATE 875-125 MG PO TABS: 1.0000 | ORAL_TABLET | Freq: Two times a day (BID) | ORAL | 0 refills | Status: DC

## 2020-11-30 NOTE — Telephone Encounter (Signed)
Spoke to pt told her so I hear you do not want to go to Urgent care. Told herI had Dr. Jimmey Ralph look at the picture you sent. He said looks like a reaction to virus you are having can try Hydrocortisone cream and Benadryl. Told her COIVD test is not back yet. Pt verbalized understanding and said she is more concerned about her breathing with her Asthma. Asked pt if having SOB? Pt said yes and using her inhaler more. Told pt you really need to go to Urgent care to be evaluated so they can check your oxygen level and do x-ray if needed. Pt verbalized understanding and will go to Urgent Care [place].

## 2020-11-30 NOTE — Telephone Encounter (Signed)
I called pt see other message from My Chart message.

## 2020-11-30 NOTE — ED Triage Notes (Signed)
Pt presents with rash on hands and feet, fever, sore throat, ear pain, headache, nausea, diarrhea, and nasal congestion xs 2 days. Last dose of dayquil was this am, unsure of time.   States was seen by PCP yesterday and tested negative for Flu and Strep yesterday. Waiting on COVID results.   States was on antibiotic for Strep and last dose was 11/17/20.

## 2020-11-30 NOTE — Discharge Instructions (Addendum)
-  Take the antibiotic- Augmentin twice daily x7 days -Continue using your albuterol inhaler as needed -I sent a prescription for a steroid- prednisone 1 pill in the morning x5 days. This will help with the rash/allergic reaction. It's also the treatment for asthma exacerbations.  -You can try Benedryl for symptomatic relief from the rash/itching -Head straight to the ED if your face/lips/tongue starts swelling; if your throat feels like it's closing;e tc.

## 2020-11-30 NOTE — Telephone Encounter (Signed)
Patient called in stating she is expierencing  a rash on both feet, she states they are burning and itching and mild swelling, Patient is unable to be seen in office due to COVID related symptoms and COVID test results have not came back yet. Patient was seen yesterday by Lelon Mast was swabbed for COVID,flu and ,strep and still awaiting results. I spoke to donna and [employer]. conclusion Was recommended to go to urgent care and patient states she is going to try to take a benadryl instead of going to an urgent care.

## 2020-11-30 NOTE — ED Provider Notes (Signed)
[facility]    CSN: [id number] Arrival date & time: 11/30/20  1533      History   Chief Complaint No chief complaint on file.   HPI Hayley Jensen is a 25 y.o. female presenting with multiple complaints.  History of ADHD anxiety asthma ovarian cyst depression endometriosis chickenpox shingles HSV 1. -Presenting today with rash on hands and feet.  Rash started 3 hours ago.  Patient states rash is itchy and she feels like her hands and feet are swelling.  Denies facial lip tongue etc. swelling -Patient also endorses fevers sore throat ear pain headache nausea without vomiting diarrhea nasal congestion.  Denies hearing changes dizziness tinnitus.  States she has a prescription of Zofran at home that has not provided relief -She endorses shortness of breath and dyspnea on exertion.  States that she has a history of asthma for which she uses albuterol inhaler.  She is currently using albuterol inhaler without relief. -She states she was positive for strep and took an antibiotic for this last dose was 11/17/2020.  She tested negative for strep and flu 1 day ago at PCP office.  She is still awaiting Covid test result.  HPI  Past Medical History:  Diagnosis Date  . ADHD   . Anxiety   . Asthma   . Bilateral ovarian cysts   . Depression   . Dysmenorrhea   . Endometriosis   . History of chicken pox   . History of shingles 2009  . HSV-1 infection   . Migraines    with aura  . Ovarian cyst     Patient Active Problem List   Diagnosis Date Noted  . Migraine with aura, intractable 03/09/2019  . Dysmenorrhea 05/18/2016  . Attention deficit hyperactivity disorder (ADHD), predominantly inattentive type 09/26/2014  . Cyst of right ovary 04/21/2014  . Asthma 10/28/2010  . GAD (generalized anxiety disorder) 10/28/2008    Past Surgical History:  Procedure Laterality Date  . APPENDECTOMY    . PELVIC LAPAROSCOPY  03/18/2012   laparoscopic exicision of peritoneal lesion  (endosalpingosis), indicental appendectomy    OB History    Gravida  0   Para  0   Term  0   Preterm  0   AB  0   Living  0     SAB  0   IAB  0   Ectopic  0   Multiple  0   Live Births  0            Home Medications    Prior to Admission medications   Medication Sig Start Date End Date Taking? Authorizing Provider  amoxicillin-clavulanate (AUGMENTIN) 875-125 MG tablet Take 1 tablet by mouth every 12 (twelve) hours. 11/30/20  Yes Rhys Martini, PA-C  predniSONE (DELTASONE) 20 MG tablet Take 1 tablet (20 mg total) by mouth daily for 5 days. 11/30/20 12/05/20 Yes Rhys Martini, PA-C  AIMOVIG 140 MG/ML SOAJ INJECT 140MG  UNDER THE SKIN ONCE EVERY 28 DAYS 11/20/20   Drema Dallas, DO  albuterol (VENTOLIN HFA) 108 (90 Base) MCG/ACT inhaler Inhale 1 puff into the lungs every 6 (six) hours as needed for wheezing or shortness of breath. 05/31/19   Doristine Bosworth, MD  amphetamine-dextroamphetamine (ADDERALL) 20 MG tablet Take 1 tablet (20 mg total) by mouth 2 (two) times daily. 12/08/19   Doristine Bosworth, MD  amphetamine-dextroamphetamine (ADDERALL) 20 MG tablet Take 1 tablet (20 mg total) by mouth 2 (two) times daily. 11/03/20 12/03/20  Jarold Motto, PA  amphetamine-dextroamphetamine (ADDERALL) 20 MG tablet Take 1 tablet (20 mg total) by mouth 2 (two) times daily. 12/03/20 01/02/21  Jarold Motto, PA  amphetamine-dextroamphetamine (ADDERALL) 20 MG tablet Take 1 tablet (20 mg total) by mouth 2 (two) times daily. 01/02/21 02/01/21  Jarold Motto, PA  hydrOXYzine (ATARAX/VISTARIL) 25 MG tablet Take 1 or 2 pills 3 times daily as needed for itching. 06/22/19   Peyton Najjar, MD  Ketorolac Tromethamine (SPRIX) 15.75 MG/SPRAY SOLN 1 spray in each nostril every 6 to 8 hours x 4.  Maximum 4 doses (1 spray each nostril) in 24 hours.  Do not exceed more than 5 days in 30 day period. Patient not taking: No sig reported 10/11/20   Drema Dallas, DO  levonorgestrel [facility]) 19.5 MG IUD by  Intrauterine route once. Inserted in November 2021, needs to be removed 08/2025    [provider]  naproxen (NAPROSYN) 500 MG tablet Take by mouth as needed.  11/05/15   [provider]  ondansetron (ZOFRAN ODT) 4 MG disintegrating tablet Take 1 every 6 hours as needed for nausea Patient not taking: Reported on 11/29/2020 09/08/19   Doristine Bosworth, MD  valACYclovir (VALTREX) 500 MG tablet Take 1 tablet (500 mg total) by mouth 2 (two) times daily. Take for three days.  Repeat course at first sign of a new outbreak. 11/03/20   Jarold Motto, PA    Family History Family History  Problem Relation Age of Onset  . Hypertension Father   . Breast cancer Maternal Grandmother   . Hypertension Maternal Grandfather   . Ovarian cancer Paternal Grandmother 69       Dec from ovarian ca  . Cancer Paternal Grandmother        uterine  . Diabetes Paternal Grandmother   . Hypertension Paternal Grandfather   . Kidney cancer Paternal Grandfather     Social History Social History   Tobacco Use  . Smoking status: Never Smoker  . Smokeless tobacco: Never Used  Vaping Use  . Vaping Use: Never used  Substance Use Topics  . Alcohol use: Yes    Alcohol/week: 1.0 standard drink    Types: 1 Glasses of wine per week  . Drug use: No     Allergies   Wellbutrin [bupropion]   Review of Systems Review of Systems  Constitutional: Positive for chills. Negative for appetite change and fever.  HENT: Positive for congestion. Negative for ear pain, rhinorrhea, sinus pressure, sinus pain and sore throat.   Eyes: Negative for redness and visual disturbance.  Respiratory: Positive for cough and shortness of breath. Negative for chest tightness and wheezing.   Cardiovascular: Negative for chest pain and palpitations.  Gastrointestinal: Positive for diarrhea and nausea. Negative for abdominal pain, constipation and vomiting.  Genitourinary: Negative for dysuria, frequency and urgency.   Musculoskeletal: Negative for myalgias.  Skin: Positive for rash.  Neurological: Negative for dizziness, weakness and headaches.  Psychiatric/Behavioral: Negative for confusion.  All other systems reviewed and are negative.    Physical Exam Triage Vital Signs ED Triage Vitals  Enc Vitals Group     BP 11/30/20 1640 113/77     Pulse Rate 11/30/20 1640 94     Resp --      Temp 11/30/20 1640 99.6 F (37.6 C)     Temp Source 11/30/20 1640 Oral     SpO2 11/30/20 1640 100 %     Weight --      Height --  Head Circumference --      Peak Flow --      Pain Score 11/30/20 1638 4     Pain Loc --      Pain Edu? --      Excl. in GC? --    No data found.  Updated Vital Signs BP 113/77 (BP Location: Right Arm)   Pulse 94   Temp 99.6 F (37.6 C) (Oral)   LMP 11/08/2020 (Approximate)   SpO2 100%   Visual Acuity Right Eye Distance:   Left Eye Distance:   Bilateral Distance:    Right Eye Near:   Left Eye Near:    Bilateral Near:     Physical Exam Vitals reviewed.  Constitutional:      General: She is not in acute distress.    Appearance: Normal appearance. She is not ill-appearing.  HENT:     Head: Normocephalic and atraumatic.     Right Ear: Hearing, tympanic membrane, ear canal and external ear normal. No swelling or tenderness. There is no impacted cerumen. No mastoid tenderness. Tympanic membrane is not perforated, erythematous, retracted or bulging.     Left Ear: Hearing, ear canal and external ear normal. No swelling or tenderness. There is no impacted cerumen. No mastoid tenderness. Tympanic membrane is erythematous. Tympanic membrane is not perforated, retracted or bulging.     Nose:     Right Sinus: No maxillary sinus tenderness or frontal sinus tenderness.     Left Sinus: No maxillary sinus tenderness or frontal sinus tenderness.     Mouth/Throat:     Mouth: Mucous membranes are moist.     Pharynx: Uvula midline. Posterior oropharyngeal erythema present. No  oropharyngeal exudate.     Tonsils: No tonsillar exudate. 1+ on the right. 1+ on the left.     Comments: No pharyngeal/lip/tongue/face swelling Cardiovascular:     Rate and Rhythm: Normal rate and regular rhythm.     Heart sounds: Normal heart sounds.  Pulmonary:     Breath sounds: Normal air entry. Wheezing present. No decreased breath sounds, rhonchi or rales.     Comments: Scattered wheezes throughout  Chest:     Chest wall: No tenderness.  Abdominal:     General: Abdomen is flat. Bowel sounds are normal.     Tenderness: There is no abdominal tenderness. There is no guarding or rebound.  Lymphadenopathy:     Cervical: No cervical adenopathy.  Skin:    Comments: Scattered maculopapular rash, worst on hands and feet. No swelling of hands/feet.   Neurological:     General: No focal deficit present.     Mental Status: She is alert and oriented to person, place, and time.  Psychiatric:        Attention and Perception: Attention and perception normal.        Mood and Affect: Mood and affect normal.        Behavior: Behavior normal. Behavior is cooperative.        Thought Content: Thought content normal.        Judgment: Judgment normal.      UC Treatments / Results  Labs (all labs ordered are listed, but only abnormal results are displayed) Labs Reviewed - No data to display  EKG   Radiology DG Chest 2 View  Result Date: 11/30/2020 CLINICAL DATA:  Short of breath.  Upper respiratory symptoms. EXAM: CHEST - 2 VIEW COMPARISON:  None. FINDINGS: The heart size and mediastinal contours are within normal limits. Both lungs are  clear. The visualized skeletal structures are unremarkable. IMPRESSION: No active cardiopulmonary disease. Electronically Signed   By: Marlan Palau M.D.   On: 11/30/2020 17:53    Procedures Procedures (including critical care time)  Medications Ordered in UC Medications  methylPREDNISolone sodium succinate (SOLU-MEDROL) 125 mg/2 mL injection 60 mg (60  mg Intramuscular Given 11/30/20 1745)    Initial Impression / Assessment and Plan / UC Course  I have reviewed the triage vital signs and the nursing notes.  Pertinent labs & imaging results that were available during my care of the patient were reviewed by me and considered in my medical decision making (see chart for details).     Strep negative 1 day ago, influenza negative, still awaiting covid result. Declined additional covid test today.   afebrile nontachycardic nontachypneic, oxygenating well on room air. Scattered wheezes throughout. paitent with history of asthma that is currently poorly controlled on albuterol inhaler. CXR showing No active cardiopulmonary disease.   For rash, suspect allergic rash. Solumedrol administered today. Prednisone as below.   For AOM, augmentin as below.   For asthma, continue albuterol. Prednisone as below.   Strict return precautions for this patient.  If she experiences face tongue lip throat etc. swelling she understands to head straight to the ER.  If her symptoms worsen or persist she can follow-up with [place] or primary care.  Spent over 40 minutes obtaining H&P, performing physical, interpreting films, discussing results, treatment plan and plan for follow-up with patient. Patient agrees with plan.     Final Clinical Impressions(s) / UC Diagnoses   Final diagnoses:  Acute upper respiratory infection  Non-recurrent acute suppurative otitis media of left ear without spontaneous rupture of tympanic membrane  Asthma with acute exacerbation, unspecified asthma severity, unspecified whether persistent     Discharge Instructions     -Take the antibiotic- Augmentin twice daily x7 days -Continue using your albuterol inhaler as needed -I sent a prescription for a steroid- prednisone 1 pill in the morning x5 days. This will help with the rash/allergic reaction. It's also the treatment for asthma exacerbations.  -You can try Benedryl for symptomatic  relief from the rash/itching -Head straight to the ED if your face/lips/tongue starts swelling; if your throat feels like it's closing;e tc.     ED Prescriptions    Medication Sig Dispense Auth. Provider   amoxicillin-clavulanate (AUGMENTIN) 875-125 MG tablet Take 1 tablet by mouth every 12 (twelve) hours. 14 tablet Rhys Martini, PA-C   predniSONE (DELTASONE) 20 MG tablet Take 1 tablet (20 mg total) by mouth daily for 5 days. 5 tablet Rhys Martini, PA-C     PDMP not reviewed this encounter.   Rhys Martini, PA-C 11/30/20 1810

## 2020-12-01 ENCOUNTER — Other Ambulatory Visit: Payer: Self-pay | Admitting: Physician Assistant

## 2020-12-01 ENCOUNTER — Ambulatory Visit: Payer: PRIVATE HEALTH INSURANCE | Admitting: Physician Assistant

## 2020-12-01 ENCOUNTER — Other Ambulatory Visit (INDEPENDENT_AMBULATORY_CARE_PROVIDER_SITE_OTHER): Payer: No Typology Code available for payment source

## 2020-12-01 ENCOUNTER — Encounter: Payer: Self-pay | Admitting: Physician Assistant

## 2020-12-01 ENCOUNTER — Other Ambulatory Visit: Payer: Self-pay

## 2020-12-01 DIAGNOSIS — R21 Rash and other nonspecific skin eruption: Secondary | ICD-10-CM | POA: Diagnosis not present

## 2020-12-01 LAB — SARS-COV-2, NAA 2 DAY TAT

## 2020-12-01 LAB — NOVEL CORONAVIRUS, NAA: SARS-CoV-2, NAA: NOT DETECTED

## 2020-12-01 LAB — POCT MONO (EPSTEIN BARR VIRUS): Mono, POC: NEGATIVE

## 2021-01-04 ENCOUNTER — Encounter: Payer: Self-pay | Admitting: Obstetrics and Gynecology

## 2021-01-04 ENCOUNTER — Ambulatory Visit (INDEPENDENT_AMBULATORY_CARE_PROVIDER_SITE_OTHER): Payer: PRIVATE HEALTH INSURANCE | Admitting: Obstetrics and Gynecology

## 2021-01-04 ENCOUNTER — Other Ambulatory Visit (HOSPITAL_COMMUNITY)
Admission: RE | Admit: 2021-01-04 | Discharge: 2021-01-04 | Disposition: A | Discharge status: Home / Self Care | Payer: PRIVATE HEALTH INSURANCE | Source: Ambulatory Visit | Attending: Obstetrics and Gynecology | Admitting: Obstetrics and Gynecology

## 2021-01-04 ENCOUNTER — Other Ambulatory Visit: Payer: Self-pay

## 2021-01-04 ENCOUNTER — Telehealth: Payer: Self-pay | Admitting: Obstetrics and Gynecology

## 2021-01-04 VITALS — BP 100/62 | HR 69 | Ht 63.0 in | Wt 97.0 lb

## 2021-01-04 DIAGNOSIS — L709 Acne, unspecified: Secondary | ICD-10-CM

## 2021-01-04 DIAGNOSIS — Z01419 Encounter for gynecological examination (general) (routine) without abnormal findings: Secondary | ICD-10-CM | POA: Diagnosis not present

## 2021-01-04 DIAGNOSIS — Z113 Encounter for screening for infections with a predominantly sexual mode of transmission: Secondary | ICD-10-CM | POA: Diagnosis not present

## 2021-01-04 NOTE — Telephone Encounter (Signed)
Please make a referral to Dr. Leonie Man, [facility], for adult acne.   I have placed an order in the work queue.

## 2021-01-04 NOTE — Patient Instructions (Signed)

## 2021-01-04 NOTE — Progress Notes (Signed)
25 y.o. G0P0000 Single Caucasian female here for annual exam.    Menses are lighter with spotting only.  Having some increased acne, which is concerning to her. Using Retin A.   Pelvic pain is controlled with Kyleena.   She would like STD testing.   Had a UTI and strep this year.  She broke out in hives.  Negative for Covid and flu.  Graduates in December, 2022.  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   PCP: Jarold Motto, PA    No LMP recorded. (Menstrual status: IUD).           Sexually active: Yes.    The current method of family planning is [place] IUD inserted 09/07/20.    Exercising: Yes.    running Smoker:  no  Health Maintenance: Pap:  12/14/19 Neg:Neg HR HPV History of abnormal Pap:  no TDaP:  12/14/19 Gardasil:   Completed series HIV: 2021 negative Hep C: 2021 negative Screening Labs:  Discuss if needed   reports that she has never smoked. She has never used smokeless tobacco. She reports current alcohol use of about 1.0 standard drink of alcohol per week. She reports that she does not use drugs.  Past Medical History:  Diagnosis Date  . ADHD   . Anxiety   . Asthma   . Bilateral ovarian cysts   . Depression   . Dysmenorrhea   . Endometriosis   . History of chicken pox   . History of shingles 2009  . HSV-1 infection   . Migraines    with aura  . Ovarian cyst     Past Surgical History:  Procedure Laterality Date  . APPENDECTOMY    . PELVIC LAPAROSCOPY  03/18/2012   laparoscopic exicision of peritoneal lesion (endosalpingosis), indicental appendectomy    Current Outpatient Medications  Medication Sig Dispense Refill  . AIMOVIG 140 MG/ML SOAJ INJECT 140MG  UNDER THE SKIN ONCE EVERY 28 DAYS 1 mL 1  . albuterol (VENTOLIN HFA) 108 (90 Base) MCG/ACT inhaler Inhale 1 puff into the lungs every 6 (six) hours as needed for wheezing or shortness of breath. 18 g 3  . amphetamine-dextroamphetamine (ADDERALL) 20 MG tablet Take 1 tablet (20 mg total) by  mouth 2 (two) times daily. 60 tablet 0  . hydrOXYzine (ATARAX/VISTARIL) 25 MG tablet Take 1 or 2 pills 3 times daily as needed for itching. 40 tablet 0  . levonorgestrel (KYLEENA) 19.5 MG IUD by Intrauterine route once. Inserted in November 2021, needs to be removed 08/2025    . naproxen (NAPROSYN) 500 MG tablet Take by mouth as needed.     . ondansetron (ZOFRAN ODT) 4 MG disintegrating tablet Take 1 every 6 hours as needed for nausea 20 tablet 0  . SUMAtriptan (TOSYMRA) 10 MG/ACT SOLN Place into the nose as needed.    . valACYclovir (VALTREX) 500 MG tablet Take 1 tablet (500 mg total) by mouth 2 (two) times daily. Take for three days.  Repeat course at first sign of a new outbreak. 30 tablet 3  . Ketorolac Tromethamine (SPRIX) 15.75 MG/SPRAY SOLN 1 spray in each nostril every 6 to 8 hours x 4.  Maximum 4 doses (1 spray each nostril) in 24 hours.  Do not exceed more than 5 days in 30 day period. (Patient not taking: No sig reported) 5 each 5   No current facility-administered medications for this visit.    Family History  Problem Relation Age of Onset  . Hypertension Father   .  Breast cancer Maternal Grandmother   . Hypertension Maternal Grandfather   . Ovarian cancer Paternal Grandmother 37       Dec from ovarian ca  . Cancer Paternal Grandmother        uterine  . Diabetes Paternal Grandmother   . Hypertension Paternal Grandfather   . Kidney cancer Paternal Grandfather     Review of Systems  Constitutional: Negative.   HENT: Negative.   Eyes: Negative.   Respiratory: Negative.   Cardiovascular: Negative.   Gastrointestinal: Negative.   Endocrine: Negative.   Genitourinary: Negative.   Musculoskeletal: Negative.   Skin: Negative.   Allergic/Immunologic: Negative.   Neurological: Negative.   Hematological: Negative.   Psychiatric/Behavioral: Negative.     Exam:   BP 100/62   Pulse 69   Ht 5\' 3"  (1.6 m)   Wt 97 lb (44 kg)   SpO2 97%   BMI 17.18 kg/m     General  appearance: alert, cooperative and appears stated age Head: normocephalic, without obvious abnormality, atraumatic Neck: no adenopathy, supple, symmetrical, trachea midline and thyroid normal to inspection and palpation Lungs: clear to auscultation bilaterally Breasts: normal appearance, no masses or tenderness, No nipple retraction or dimpling, No nipple discharge or bleeding, No axillary adenopathy Heart: regular rate and rhythm Abdomen: soft, non-tender; no masses, no organomegaly Extremities: extremities normal, atraumatic, no cyanosis or edema Skin: skin color, texture, turgor normal. No rashes or lesions Lymph nodes: cervical, supraclavicular, and axillary nodes normal. Neurologic: grossly normal  Pelvic: External genitalia:  no lesions              No abnormal inguinal nodes palpated.              Urethra:  normal appearing urethra with no masses, tenderness or lesions              Bartholins and Skenes: normal                 Vagina: normal appearing vagina with normal color and discharge, no lesions              Cervix: no lesions.  IUD strings noted.              Pap taken: No. Bimanual Exam:  Uterus:  normal size, contour, position, consistency, mobility, non-tender              Adnexa: no mass, fullness, tenderness              Chaperone was present for exam.  Assessment:   Well woman visit with normal exam. Kyleena IUD.  Acne.  STD screening.  Migraine with aura.  HSV I.  Plan: Mammogram screening discussed. Self breast awareness reviewed. Pap and HR HPV as above. Guidelines for Calcium, Vitamin D, regular exercise program including cardiovascular and weight bearing exercise. Referral to dermatology.  STD screening.  Follow up annually and prn.

## 2021-01-05 LAB — CERVICOVAGINAL ANCILLARY ONLY
Chlamydia: NEGATIVE
Comment: NEGATIVE
Comment: NEGATIVE
Comment: NORMAL
Neisseria Gonorrhea: NEGATIVE
Trichomonas: NEGATIVE

## 2021-01-05 LAB — RPR: RPR Ser Ql: NONREACTIVE

## 2021-01-05 LAB — HEPATITIS B SURFACE ANTIGEN: Hepatitis B Surface Ag: NONREACTIVE

## 2021-01-05 LAB — HEPATITIS C ANTIBODY
Hepatitis C Ab: NONREACTIVE
SIGNAL TO CUT-OFF: 0.46 (ref <1.00)

## 2021-01-05 LAB — HIV ANTIBODY (ROUTINE TESTING W REFLEX): HIV 1&2 Ab, 4th Generation: NONREACTIVE

## 2021-01-12 ENCOUNTER — Other Ambulatory Visit: Payer: Self-pay | Admitting: Neurology

## 2021-02-08 ENCOUNTER — Other Ambulatory Visit: Payer: Self-pay

## 2021-02-08 ENCOUNTER — Encounter: Payer: Self-pay | Admitting: Physician Assistant

## 2021-02-08 ENCOUNTER — Ambulatory Visit (INDEPENDENT_AMBULATORY_CARE_PROVIDER_SITE_OTHER): Payer: No Typology Code available for payment source | Admitting: Physician Assistant

## 2021-02-08 VITALS — BP 102/69 | HR 90 | Temp 98.0°F | Ht 63.0 in | Wt 96.5 lb

## 2021-02-08 DIAGNOSIS — R300 Dysuria: Secondary | ICD-10-CM | POA: Diagnosis not present

## 2021-02-08 DIAGNOSIS — R350 Frequency of micturition: Secondary | ICD-10-CM

## 2021-02-08 LAB — POC URINALSYSI DIPSTICK (AUTOMATED)
Bilirubin, UA: NEGATIVE
Blood, UA: NEGATIVE
Glucose, UA: NEGATIVE
Ketones, UA: NEGATIVE
Leukocytes, UA: NEGATIVE
Nitrite, UA: NEGATIVE
Protein, UA: NEGATIVE
Spec Grav, UA: 1.01 (ref 1.010–1.025)
Urobilinogen, UA: 0.2 E.U./dL
pH, UA: 6.5 (ref 5.0–8.0)

## 2021-02-08 NOTE — Patient Instructions (Addendum)
Good news is that your urinalysis looks clear! I will send off for culture and call with results on Monday or Tuesday. Finish the macrobid completely. Push plenty of water this weekend. AZO is ok to use for 2 days. ER if severely worsening or change in symptoms such as fever or inability to urinate.

## 2021-02-08 NOTE — Progress Notes (Signed)
Acute Office Visit  Subjective:    Patient ID: Hayley Jensen, female    DOB: 23-Jun-1996, 25 y.o.   MRN: [id number]  Chief Complaint  Patient presents with  . Urinary Frequency    HPI Patient is in today for dysuria starting on 02/03/21. Called [facility] for probable UTI and they started her on Macrobid, last dose will be tomorrow. Now having right "kidney" pressure pain and some hematuria yesterday. She has had kidney stones previously, but says it doesn't feel like that. No fever or chills. No abdominal pain. No N/V. Some ongoing frequency and intermittent dysuria still. Feels like she has had recurrent infections since IUD placement in Nov 2021 (has appt next week to discuss with her PCP).  Past Medical History:  Diagnosis Date  . ADHD   . Anxiety   . Asthma   . Bilateral ovarian cysts   . Depression   . Dysmenorrhea   . Endometriosis   . History of chicken pox   . History of shingles 2009  . HSV-1 infection   . Migraines    with aura  . Ovarian cyst     Past Surgical History:  Procedure Laterality Date  . APPENDECTOMY    . PELVIC LAPAROSCOPY  03/18/2012   laparoscopic exicision of peritoneal lesion (endosalpingosis), indicental appendectomy    Family History  Problem Relation Age of Onset  . Hypertension Father   . Breast cancer Maternal Grandmother   . Hypertension Maternal Grandfather   . Ovarian cancer Paternal Grandmother 39       Dec from ovarian ca  . Cancer Paternal Grandmother        uterine  . Diabetes Paternal Grandmother   . Hypertension Paternal Grandfather   . Kidney cancer Paternal Grandfather     Social History   Socioeconomic History  . Marital status: Single    Spouse name: Not on file  . Number of children: Not on file  . Years of education: Not on file  . Highest education level: Not on file  Occupational History  . Not on file  Tobacco Use  . Smoking status: Never Smoker  . Smokeless tobacco: Never Used  Vaping Use  . Vaping Use:  Never used  Substance and Sexual Activity  . Alcohol use: Yes    Alcohol/week: 1.0 standard drink    Types: 1 Glasses of wine per week  . Drug use: No  . Sexual activity: Yes    Birth control/protection: I.U.D.  Other Topics Concern  . Not on file  Social History Narrative  . Not on file   Social Determinants of Health   Financial Resource Strain: Not on file  Food Insecurity: Not on file  Transportation Needs: Not on file  Physical Activity: Not on file  Stress: Not on file  Social Connections: Not on file  Intimate Partner Violence: Not on file    Outpatient Medications Prior to Visit  Medication Sig Dispense Refill  . AIMOVIG 140 MG/ML SOAJ INJECT 140MG  UNDER THE SKIN ONCE EVERY 28 DAYS 1 mL 1  . albuterol (VENTOLIN HFA) 108 (90 Base) MCG/ACT inhaler Inhale 1 puff into the lungs every 6 (six) hours as needed for wheezing or shortness of breath. 18 g 3  . amphetamine-dextroamphetamine (ADDERALL) 20 MG tablet Take 1 tablet (20 mg total) by mouth 2 (two) times daily. 60 tablet 0  . hydrOXYzine (ATARAX/VISTARIL) 25 MG tablet Take 1 or 2 pills 3 times daily as needed for itching. 40 tablet 0  .  levonorgestrel (KYLEENA) 19.5 MG IUD by Intrauterine route once. Inserted in November 2021, needs to be removed 08/2025    . naproxen (NAPROSYN) 500 MG tablet Take by mouth as needed.     . nitrofurantoin, macrocrystal-monohydrate, (MACROBID) 100 MG capsule Take 100 mg by mouth 2 (two) times daily.    . ondansetron (ZOFRAN ODT) 4 MG disintegrating tablet Take 1 every 6 hours as needed for nausea 20 tablet 0  . SUMAtriptan (TOSYMRA) 10 MG/ACT SOLN Place into the nose as needed.    . valACYclovir (VALTREX) 500 MG tablet Take 1 tablet (500 mg total) by mouth 2 (two) times daily. Take for three days.  Repeat course at first sign of a new outbreak. 30 tablet 3  . Ketorolac Tromethamine (SPRIX) 15.75 MG/SPRAY SOLN 1 spray in each nostril every 6 to 8 hours x 4.  Maximum 4 doses (1 spray each  nostril) in 24 hours.  Do not exceed more than 5 days in 30 day period. (Patient not taking: No sig reported) 5 each 5   No facility-administered medications prior to visit.    Allergies  Allergen Reactions  . Wellbutrin [Bupropion] Nausea And Vomiting    Review of Systems REFER TO HPI FOR PERTINENT POSITIVES AND NEGATIVES     Objective:    Physical Exam Vitals and nursing note reviewed.  Constitutional:      Appearance: Normal appearance.  HENT:     Head: Normocephalic.  Cardiovascular:     Rate and Rhythm: Normal rate and regular rhythm.     Heart sounds: No murmur heard.   Pulmonary:     Effort: Pulmonary effort is normal.     Breath sounds: Normal breath sounds.  Abdominal:     General: Abdomen is flat.     Tenderness: There is no right CVA tenderness or left CVA tenderness.  Neurological:     Mental Status: She is alert.  Psychiatric:        Mood and Affect: Mood normal.        Behavior: Behavior normal.     BP 102/69   Pulse 90   Temp 98 F (36.7 C)   Ht 5\' 3"  (1.6 m)   Wt 96 lb 8 oz (43.8 kg)   SpO2 98%   BMI 17.09 kg/m  Wt Readings from Last 3 Encounters:  02/08/21 96 lb 8 oz (43.8 kg)  01/04/21 97 lb (44 kg)  11/29/20 97 lb (44 kg)    There are no preventive care reminders to display for this patient.  There are no preventive care reminders to display for this patient.   Lab Results  Component Value Date   TSH 1.440 12/08/2019   Lab Results  Component Value Date   WBC 5.5 12/08/2019   HGB 13.6 12/08/2019   HCT 39.4 12/08/2019   MCV 89 12/08/2019   PLT 226 12/08/2019   Lab Results  Component Value Date   NA 140 12/14/2019   K 4.3 12/14/2019   CO2 23 12/14/2019   GLUCOSE 77 12/14/2019   BUN 10 12/14/2019   CREATININE 0.60 12/14/2019   BILITOT 0.9 12/14/2019   ALKPHOS 51 12/14/2019   AST 16 12/14/2019   ALT 7 12/14/2019   PROT 7.6 12/14/2019   ALBUMIN 4.8 12/14/2019   CALCIUM 9.6 12/14/2019   Lab Results  Component Value  Date   CHOL 151 12/14/2019   Lab Results  Component Value Date   HDL 71 12/14/2019   Lab Results  Component Value  Date   LDLCALC 69 12/14/2019   Lab Results  Component Value Date   TRIG 52 12/14/2019   Lab Results  Component Value Date   CHOLHDL 2.1 12/14/2019   No results found for: HGBA1C     Assessment & Plan:   Problem List Items Addressed This Visit   None   Visit Diagnoses    Dysuria    -  Primary   Relevant Orders   Urine Culture   Frequent urination       Relevant Orders   POCT Urinalysis Dipstick (Automated) (Completed)   Urine Culture     1. Dysuria 2. Frequent urination U/A clear in office. Pt is non-toxic appearing. Will send urine for culture and treat with additional antibiotics if necessary. Encouraged to finish Macrobid course at this time. ED this weekend if acutely worse or change in symptoms.   Ily Denno M Jayme Cham, PA-C

## 2021-02-09 LAB — URINE CULTURE
MICRO NUMBER:: 11771408
Result:: NO GROWTH
SPECIMEN QUALITY:: ADEQUATE

## 2021-02-13 ENCOUNTER — Ambulatory Visit: Payer: PRIVATE HEALTH INSURANCE | Admitting: Physician Assistant

## 2021-02-14 ENCOUNTER — Ambulatory Visit (INDEPENDENT_AMBULATORY_CARE_PROVIDER_SITE_OTHER): Payer: No Typology Code available for payment source | Admitting: Physician Assistant

## 2021-02-14 ENCOUNTER — Encounter: Payer: Self-pay | Admitting: Physician Assistant

## 2021-02-14 ENCOUNTER — Other Ambulatory Visit: Payer: Self-pay

## 2021-02-14 VITALS — BP 86/60 | HR 76 | Temp 97.8°F | Ht 63.0 in | Wt 95.0 lb

## 2021-02-14 DIAGNOSIS — F900 Attention-deficit hyperactivity disorder, predominantly inattentive type: Secondary | ICD-10-CM

## 2021-02-14 DIAGNOSIS — R300 Dysuria: Secondary | ICD-10-CM

## 2021-02-14 DIAGNOSIS — M545 Low back pain, unspecified: Secondary | ICD-10-CM

## 2021-02-14 DIAGNOSIS — N390 Urinary tract infection, site not specified: Secondary | ICD-10-CM | POA: Diagnosis not present

## 2021-02-14 DIAGNOSIS — L700 Acne vulgaris: Secondary | ICD-10-CM

## 2021-02-14 LAB — COMPREHENSIVE METABOLIC PANEL
ALT: 10 U/L (ref 0–35)
AST: 16 U/L (ref 0–37)
Albumin: 4.5 g/dL (ref 3.5–5.2)
Alkaline Phosphatase: 48 U/L (ref 39–117)
BUN: 8 mg/dL (ref 6–23)
CO2: 28 mEq/L (ref 19–32)
Calcium: 9.4 mg/dL (ref 8.4–10.5)
Chloride: 105 mEq/L (ref 96–112)
Creatinine, Ser: 0.73 mg/dL (ref 0.40–1.20)
GFR: 113.05 mL/min (ref >60.00)
Glucose, Bld: 75 mg/dL (ref 70–99)
Potassium: 4.3 mEq/L (ref 3.5–5.1)
Sodium: 141 mEq/L (ref 135–145)
Total Bilirubin: 0.6 mg/dL (ref 0.2–1.2)
Total Protein: 7.6 g/dL (ref 6.0–8.3)

## 2021-02-14 LAB — CBC WITH DIFFERENTIAL/PLATELET
Basophils Absolute: 0 10*3/uL (ref 0.0–0.1)
Basophils Relative: 0.6 % (ref 0.0–3.0)
Eosinophils Absolute: 0.1 10*3/uL (ref 0.0–0.7)
Eosinophils Relative: 1.5 % (ref 0.0–5.0)
HCT: 41.4 % (ref 36.0–46.0)
Hemoglobin: 13.8 g/dL (ref 12.0–15.0)
Lymphocytes Relative: 26.2 % (ref 12.0–46.0)
Lymphs Abs: 1.6 10*3/uL (ref 0.7–4.0)
MCHC: 33.5 g/dL (ref 30.0–36.0)
MCV: 89.8 fl (ref 78.0–100.0)
Monocytes Absolute: 0.5 10*3/uL (ref 0.1–1.0)
Monocytes Relative: 7.8 % (ref 3.0–12.0)
Neutro Abs: 3.8 10*3/uL (ref 1.4–7.7)
Neutrophils Relative %: 63.9 % (ref 43.0–77.0)
Platelets: 273 10*3/uL (ref 150.0–400.0)
RBC: 4.61 Mil/uL (ref 3.87–5.11)
RDW: 13.8 % (ref 11.5–15.5)
WBC: 6 10*3/uL (ref 4.0–10.5)

## 2021-02-14 MED ORDER — AMPHETAMINE-DEXTROAMPHETAMINE 20 MG PO TABS: 20.0000 mg | ORAL_TABLET | Freq: Two times a day (BID) | ORAL | 0 refills | Status: AC

## 2021-02-14 MED ORDER — CLINDAMYCIN PHOS-BENZOYL PEROX 1-5 % EX GEL: Freq: Two times a day (BID) | CUTANEOUS | 0 refills | Status: DC

## 2021-02-14 NOTE — Progress Notes (Signed)
Hayley Jensen is a 25 y.o. female here for a follow up of a pre-existing problem.  I acted as a Neurosurgeon for [employer], PA-C Corky Mull, LPN   History of Present Illness:   Chief Complaint  Patient presents with  . Discuss frequent UTI's  . Flank Pain  . ADHD    HPI  Frequent UTI's Pt would like to discuss how many UTI's she has had in the past months and is concerned about being on so many antibiotics. Pt has also started cranberry supplements OTC. Pt completed Macrobid as recently as April 2022. She states that she has been using [facility] for her UTI's (other than in office visit in April with Alyssa PA-C.) Has had UTI symptoms and was treated via [facility] at least once during Jan and Feb 2022.  Does have hx of UTIs but they are not as frequent as they are now. Has been monogamous with one partner. Sees Dr. Edward Jolly for ob-gyn. Rutha Bouchard put in last fall.  Flank pain Pt c/o right flank pain off and on since 4/8. She saw Alyssa on 4/14 questionable kidney stone. Pt had 2 episodes of blood prior to visit and day of but nothing since. She states that her kidney pain has historically been in the lower area of her back.   ADHD Pt needs refill, currently taking Adderall 20 mg daily, will take two up to a few times a week. Tolerating medication, working well, she is able to focus and is helping mood less grumpy. Denies chest pain, palpitations, headaches, or insomnia.  Acne Has had worsening acne since her Kyleena IUD. Has tried multiple OTC remedies including: differin, salicylic acid, benzoyl peroxide, topical probiotics.  Past Medical History:  Diagnosis Date  . ADHD   . Anxiety   . Asthma   . Bilateral ovarian cysts   . Depression   . Dysmenorrhea   . Endometriosis   . History of chicken pox   . History of shingles 2009  . HSV-1 infection   . Migraines    with aura  . Ovarian cyst      Social History   Tobacco Use  . Smoking status: Never Smoker  . Smokeless  tobacco: Never Used  Vaping Use  . Vaping Use: Never used  Substance Use Topics  . Alcohol use: Yes    Alcohol/week: 1.0 standard drink    Types: 1 Glasses of wine per week  . Drug use: No    Past Surgical History:  Procedure Laterality Date  . APPENDECTOMY    . PELVIC LAPAROSCOPY  03/18/2012   laparoscopic exicision of peritoneal lesion (endosalpingosis), indicental appendectomy    Family History  Problem Relation Age of Onset  . Hypertension Father   . Breast cancer Maternal Grandmother   . Hypertension Maternal Grandfather   . Ovarian cancer Paternal Grandmother 61       Dec from ovarian ca  . Cancer Paternal Grandmother        uterine  . Diabetes Paternal Grandmother   . Hypertension Paternal Grandfather   . Kidney cancer Paternal Grandfather     Allergies  Allergen Reactions  . Wellbutrin [Bupropion] Nausea And Vomiting    Current Medications:   Current Outpatient Medications:  .  AIMOVIG 140 MG/ML SOAJ, INJECT 140MG  UNDER THE SKIN ONCE EVERY 28 DAYS, Disp: 1 mL, Rfl: 1 .  albuterol (VENTOLIN HFA) 108 (90 Base) MCG/ACT inhaler, Inhale 1 puff into the lungs every 6 (six) hours as needed for wheezing  or shortness of breath., Disp: 18 g, Rfl: 3 .  amphetamine-dextroamphetamine (ADDERALL) 20 MG tablet, Take 1 tablet (20 mg total) by mouth 2 (two) times daily., Disp: 60 tablet, Rfl: 0 .  clindamycin-benzoyl peroxide (BENZACLIN) gel, Apply topically 2 (two) times daily., Disp: 25 g, Rfl: 0 .  hydrOXYzine (ATARAX/VISTARIL) 25 MG tablet, Take 1 or 2 pills 3 times daily as needed for itching., Disp: 40 tablet, Rfl: 0 .  levonorgestrel (KYLEENA) 19.5 MG IUD, by Intrauterine route once. Inserted in November 2021, needs to be removed 08/2025, Disp: , Rfl:  .  naproxen (NAPROSYN) 500 MG tablet, Take by mouth as needed. , Disp: , Rfl:  .  ondansetron (ZOFRAN ODT) 4 MG disintegrating tablet, Take 1 every 6 hours as needed for nausea, Disp: 20 tablet, Rfl: 0 .  SUMAtriptan  (TOSYMRA) 10 MG/ACT SOLN, Place into the nose as needed., Disp: , Rfl:  .  valACYclovir (VALTREX) 500 MG tablet, Take 1 tablet (500 mg total) by mouth 2 (two) times daily. Take for three days.  Repeat course at first sign of a new outbreak., Disp: 30 tablet, Rfl: 3   Review of Systems:   ROS Negative unless otherwise specified per HPI.  Vitals:   Vitals:   02/14/21 0737  BP: (!) 86/60  Pulse: 76  Temp: 97.8 F (36.6 C)  TempSrc: Temporal  Weight: 95 lb (43.1 kg)  Height: 5\' 3"  (1.6 m)     Body mass index is 16.83 kg/m.  Physical Exam:   Physical Exam Vitals and nursing note reviewed.  Constitutional:      General: She is not in acute distress.    Appearance: She is well-developed. She is not ill-appearing or toxic-appearing.  Cardiovascular:     Rate and Rhythm: Normal rate and regular rhythm.     Pulses: Normal pulses.     Heart sounds: Normal heart sounds, S1 normal and S2 normal.     Comments: No LE edema Pulmonary:     Effort: Pulmonary effort is normal.     Breath sounds: Normal breath sounds.  Abdominal:     General: Abdomen is flat. Bowel sounds are normal.     Palpations: Abdomen is soft.     Tenderness: There is no abdominal tenderness. There is no right CVA tenderness or left CVA tenderness.  Musculoskeletal:     Comments: No decreased ROM 2/2 pain with flexion/extension, lateral side bends, or rotation. Reproducible tenderness with deep palpation to R lumbar paraspinal muscles. No bony tenderness.    Skin:    General: Skin is warm and dry.     Comments: Multiple scattered erythematous pustules on chin and cheeks  Neurological:     Mental Status: She is alert.     GCS: GCS eye subscore is 4. GCS verbal subscore is 5. GCS motor subscore is 6.  Psychiatric:        Speech: Speech normal.        Behavior: Behavior normal. Behavior is cooperative.     Assessment and Plan:   Deneane was seen today for discuss frequent uti's, flank pain and  adhd.  Diagnoses and all orders for this visit:  Frequent UTI Denies symptoms today. Referral to Urology for further evaluation. Discussed possible dx of interstitial cystitis -- but will defer dx and further work-up to urology. Encouraged adequate hydration. -     Ambulatory referral to Urology  Acute right-sided low back pain without sciatica No red flags on exam.  Update CBC and CMP  today. Suspect possible muscle strain, may trial oral NSAIDs or tylenol. -     CBC with Differential/Platelet -     Comprehensive metabolic panel  Attention deficit hyperactivity disorder (ADHD), predominantly inattentive type Well controlled. Reviewed PDMP, no red flags. Adderall 20 mg BID has been filled x 3 months. Follow-up in 6 months, sooner if concerns.  Acne vulgaris Uncontrolled. Trial benzaclin. She has a number to contact dermatologist should she need it, declined referral today.  Other orders -     clindamycin-benzoyl peroxide (BENZACLIN) gel; Apply topically 2 (two) times daily.  CMA or LPN served as scribe during this visit. History, Physical, and Plan performed by medical provider. The above documentation has been reviewed and is accurate and complete.  Jarold Motto, PA-C

## 2021-02-14 NOTE — Patient Instructions (Signed)
It was great to see you!  Update blood work today.  Push fluids -- 64 oz water daily.  You will be contacted about your referral to urology.  I have sent in benza-clin which is topical benzoyl peroxide and clindamycin gel.  Take care,  Jarold Motto PA-C

## 2021-02-15 ENCOUNTER — Telehealth: Payer: Self-pay | Admitting: *Deleted

## 2021-02-15 NOTE — Telephone Encounter (Signed)
Received fax from pharmacy requesting PA for Clinamycin Phos-Benzoyl Perox 1-5% Gel. PA started through Covermymeds. Awaiting response

## 2021-02-19 NOTE — Telephone Encounter (Signed)
PA for Clindamycin Phos-Benzoyl Perox I - 5% Gel. Approved. Approved for CLINDAMYCIN PHOS-BENZOYL PEROX Gel, quantity up to 25 per 30 days, under the pharmacy benefit. The drug has been approved from 02/19/2021 to 02/19/2022. Generic substitution required when available.  Called [facility] pharmacy and spoke to Marlette, told her PA for Clindamycin gel has been approved for one year. Callie verbalized understanding, ran Rx and went through will get ready for pt.

## 2021-03-13 ENCOUNTER — Other Ambulatory Visit: Payer: Self-pay | Admitting: Neurology

## 2021-04-11 ENCOUNTER — Ambulatory Visit: Payer: PRIVATE HEALTH INSURANCE | Admitting: Neurology

## 2021-04-14 ENCOUNTER — Other Ambulatory Visit: Payer: Self-pay | Admitting: Neurology

## 2021-05-23 ENCOUNTER — Other Ambulatory Visit: Payer: Self-pay | Admitting: Neurology

## 2021-05-23 ENCOUNTER — Other Ambulatory Visit: Payer: Self-pay | Admitting: Physician Assistant

## 2021-05-29 NOTE — Progress Notes (Unsigned)
Aimovig 140mg /55ml pending. 0m Hayley Jensen (Key: [id number]) Rx #BMS:[id number] Aimovig 140MG /ML auto-injectors   Form [employer] HIM Electronic Prior Authorization Form (Envolve) 2017 NCPDP Created 5 days ago Sent to Plan 9 minutes ago Plan Response 9 minutes ago Submit Clinical Questions 1 minute ago Determination Wait for Determination Please wait for Envolve AM Better 2017 MHK to return a determination.   Approved valid  05/29/2021 to 02/02/82023. Sent to scan

## 2021-07-25 ENCOUNTER — Telehealth: Payer: Self-pay

## 2021-07-25 NOTE — Telephone Encounter (Signed)
New message   Hayley Jensen (Key: Cory Munch) Tosymra 10MG /ACT Nasal Spray   Form [employer] HIM Electronic Prior Authorization Form (Envolve) 2017 NCPDP Created 6 days ago Sent to Plan 8 minutes ago Plan Response 8 minutes ago Submit Clinical Questions 1 minute ago Determination Wait for Determination Please wait for [employer] AM Better 2017 MHK to return a determination.

## 2021-07-25 NOTE — Telephone Encounter (Signed)
F/u   Hayley Jensen (Key: Cory Munch) Tosymra 10MG /ACT Nasal Spray   Form [employer] HIM Electronic Prior Authorization Form (Envolve) 2017 NCPDP Created 6 days ago Sent to Plan 2 hours ago Plan Response 2 hours ago Submit Clinical Questions 2 hours ago Determination Favorable 33 minutes ago Message from Plan Approved. Approved for TOSYMRA Solution 10MG /ACT, quantity up to 9 ml per 30 days, under the pharmacy benefit. The drug has been approved from 07/25/2021 to 07/25/2022. Generic substitution required when available and preferred on the formulary.

## 2021-08-08 ENCOUNTER — Telehealth: Payer: Self-pay | Admitting: Neurology

## 2021-08-08 MED ORDER — AIMOVIG 140 MG/ML ~~LOC~~ SOAJ: SUBCUTANEOUS | 2 refills | Status: AC

## 2021-08-08 NOTE — Telephone Encounter (Signed)
Per dr.Jaffe okay to send Aimovig to the [facility].

## 2021-08-08 NOTE — Telephone Encounter (Signed)
Pt called in to canx her appt. Her ins is no longer in network with [place]. She will need a refill on her aimovig. She is waiting on a call from [facility] for an appt, but doesn't think it will be no time soon. Will she be able to get a refill

## 2021-08-10 NOTE — Progress Notes (Deleted)
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 Keep headache diary Follow up ***   Subjective:  Eulonda N. Zani is a 25 year old female with ADHD and anxiety and history of kidney stones twice who follows up for migraine.   UPDATE: Last seen in December 2021.  At that time, I added nortriptyline to Aimovig, however it was discontinued due to side effects.   Due to significant nausea with her migraines, she was prescribed Sprix NS for rescue. Tosymra? Intensity:  severe Duration:  3 to 5 days (with Nurtec, severe headache lasts 12 to 24 hours).  Frequency:  2 to 3 times a month (10-15 headache days a month) Rescue therapy:  Nurtec Analgesic frequency:   Current NSAIDS:  Sprix NS, naproxen 500mg  (endometriosis) Current analgesics:  none Current triptans:   none Current ergotamine:  none Current anti-emetic:  Zofran ODT 8mg  Current muscle relaxants:  none Current anti-anxiolytic:  none Current sleep aide:  none Current Antihypertensive medications:  none Current Antidepressant medications:  none Current Anticonvulsant medications:  none Current anti-CGRP:  Aimovig 140mg , Nurtec 75mg  Current Vitamins/Herbal/Supplements:  none Current Antihistamines/Decongestants:  none Other therapy:  Ginger candy for nausea Hormone/birth control:  none Other medications:  Adderall PRN   Caffeine:  Tea.  Rarely coffee.  No energy drinks.  Cola only for headache. Diet:  64 oz water daily.  Skips meals.  But grazes during day.   Exercise:  walks Depression:  no; Anxiety:  yes.  Currently in law school. Other pain:  endometriosis Sleep hygiene:  7 to 9 hours sleep a night.   HISTORY:  She started having  migraines at age 77 to 74.  She had a visual aura with spinning or horizontal waves of rainbow colors in both eyes that impede vision, followed by nausea and vomiting.  They have progressed over the years and since around 25 years old they have been lasting several days at a time.  She still sometimes gets the visual aura.  Severe bifrontal pressure pain.  Sometimes right eye is pain worse with eye movement.  Neck and body stiffness.  Nose burns and turns red.  Ears ring and pop.  Severe nausea.  Sometimes vomiting.  Some photophobia, phonophobia, osmophobia, sometimes altered sense of smell.  No numbness or weakness.  Triggers include change in weather but does not appear to be associated with food or menstrual cycle.  No real relieving factors.  They last 10 to 14 days.  They occur once a month.     Eye exam has been unremarkable.       Past NSAIDS:  ibuprofen Past analgesics:  Fioricet; Tylenol; Excedrin Past abortive triptans:  rizatriptan, sumatriptan 25mg , Tosymra NS Past abortive ergotamine:  none Past muscle relaxants:  none Past anti-emetic:  none Past antihypertensive medications:  Propranolol (dizziness, fatigue) Past antidepressant medications:  Nortriptyline (dehydration, constipation), citalopram Past anticonvulsant medications:  none Past anti-CGRP:  none Past vitamins/Herbal/Supplements:  none Past antihistamines/decongestants:  none Other past therapies:  Sleep; peppermint; some cola     Family history of headache:  Mother (migraines); maternal grandfather (migraines with seizures)  PAST MEDICAL HISTORY: Past Medical History:  Diagnosis Date   ADHD    Anxiety  Asthma    Bilateral ovarian cysts    Depression    Dysmenorrhea    Endometriosis    History of chicken pox    History of shingles 2009   HSV-1 infection    Migraines    with aura   Ovarian cyst     MEDICATIONS: Current Outpatient Medications on File Prior to Visit  Medication Sig Dispense Refill    albuterol (VENTOLIN HFA) 108 (90 Base) MCG/ACT inhaler Inhale 1 puff into the lungs every 6 (six) hours as needed for wheezing or shortness of breath. 18 g 3   amphetamine-dextroamphetamine (ADDERALL) 20 MG tablet Take 1 tablet (20 mg total) by mouth 2 (two) times daily. 60 tablet 0   amphetamine-dextroamphetamine (ADDERALL) 20 MG tablet Take 1 tablet (20 mg total) by mouth 2 (two) times daily. 60 tablet 0   amphetamine-dextroamphetamine (ADDERALL) 20 MG tablet Take 1 tablet (20 mg total) by mouth 2 (two) times daily. 60 tablet 0   clindamycin-benzoyl peroxide (BENZACLIN) gel APPLY TO AFFECTED AREA(S) TWO TIMES A DAY 25 g 0   Erenumab-aooe (AIMOVIG) 140 MG/ML SOAJ INJECT UNDER THE SKIN EVERY 28 DAYS 1 mL 2   hydrOXYzine (ATARAX/VISTARIL) 25 MG tablet Take 1 or 2 pills 3 times daily as needed for itching. 40 tablet 0   levonorgestrel (KYLEENA) 19.5 MG IUD by Intrauterine route once. Inserted in November 2021, needs to be removed 08/2025     naproxen (NAPROSYN) 500 MG tablet Take by mouth as needed.      ondansetron (ZOFRAN ODT) 4 MG disintegrating tablet Take 1 every 6 hours as needed for nausea 20 tablet 0   SUMAtriptan (TOSYMRA) 10 MG/ACT SOLN Place into the nose as needed.     valACYclovir (VALTREX) 500 MG tablet Take 1 tablet (500 mg total) by mouth 2 (two) times daily. Take for three days.  Repeat course at first sign of a new outbreak. 30 tablet 3   No current facility-administered medications on file prior to visit.    ALLERGIES: Allergies  Allergen Reactions   Wellbutrin [Bupropion] Nausea And Vomiting    FAMILY HISTORY: Family History  Problem Relation Age of Onset   Hypertension Father    Breast cancer Maternal Grandmother    Hypertension Maternal Grandfather    Ovarian cancer Paternal Grandmother 59       Dec from ovarian ca   Cancer Paternal Grandmother        uterine   Diabetes Paternal Grandmother    Hypertension Paternal Grandfather    Kidney cancer Paternal  Grandfather       Objective:  *** General: No acute distress.  Patient appears well-groomed.   Head:  Normocephalic/atraumatic Eyes:  Fundi examined but not visualized Neck: supple, no paraspinal tenderness, full range of motion Heart:  Regular rate and rhythm Lungs:  Clear to auscultation bilaterally Back: No paraspinal tenderness Neurological Exam: alert and oriented to person, place, and time.  Speech fluent and not dysarthric, language intact.  CN II-XII intact. Bulk and tone normal, muscle strength 5/5 throughout.  Sensation to light touch intact.  Deep tendon reflexes 2+ throughout, toes downgoing.  Finger to nose testing intact.  Gait normal, Romberg negative.   Shon Millet, DO  CC: Jarold Motto, PA

## 2021-08-13 ENCOUNTER — Ambulatory Visit: Payer: No Typology Code available for payment source | Admitting: Neurology

## 2022-01-07 ENCOUNTER — Ambulatory Visit: Payer: Self-pay | Admitting: Obstetrics and Gynecology

## 2022-07-23 ENCOUNTER — Other Ambulatory Visit (HOSPITAL_COMMUNITY): Payer: Self-pay

## 2022-10-10 ENCOUNTER — Encounter: Payer: Self-pay | Admitting: *Deleted

## 2023-01-06 IMAGING — DX DG CHEST 2V
2 series · 2 of 2 positions shown · non-contrast
Comparison: None.

CLINICAL DATA: Short of breath.  Upper respiratory symptoms.

EXAM:
CHEST - 2 VIEW

[chest pa]
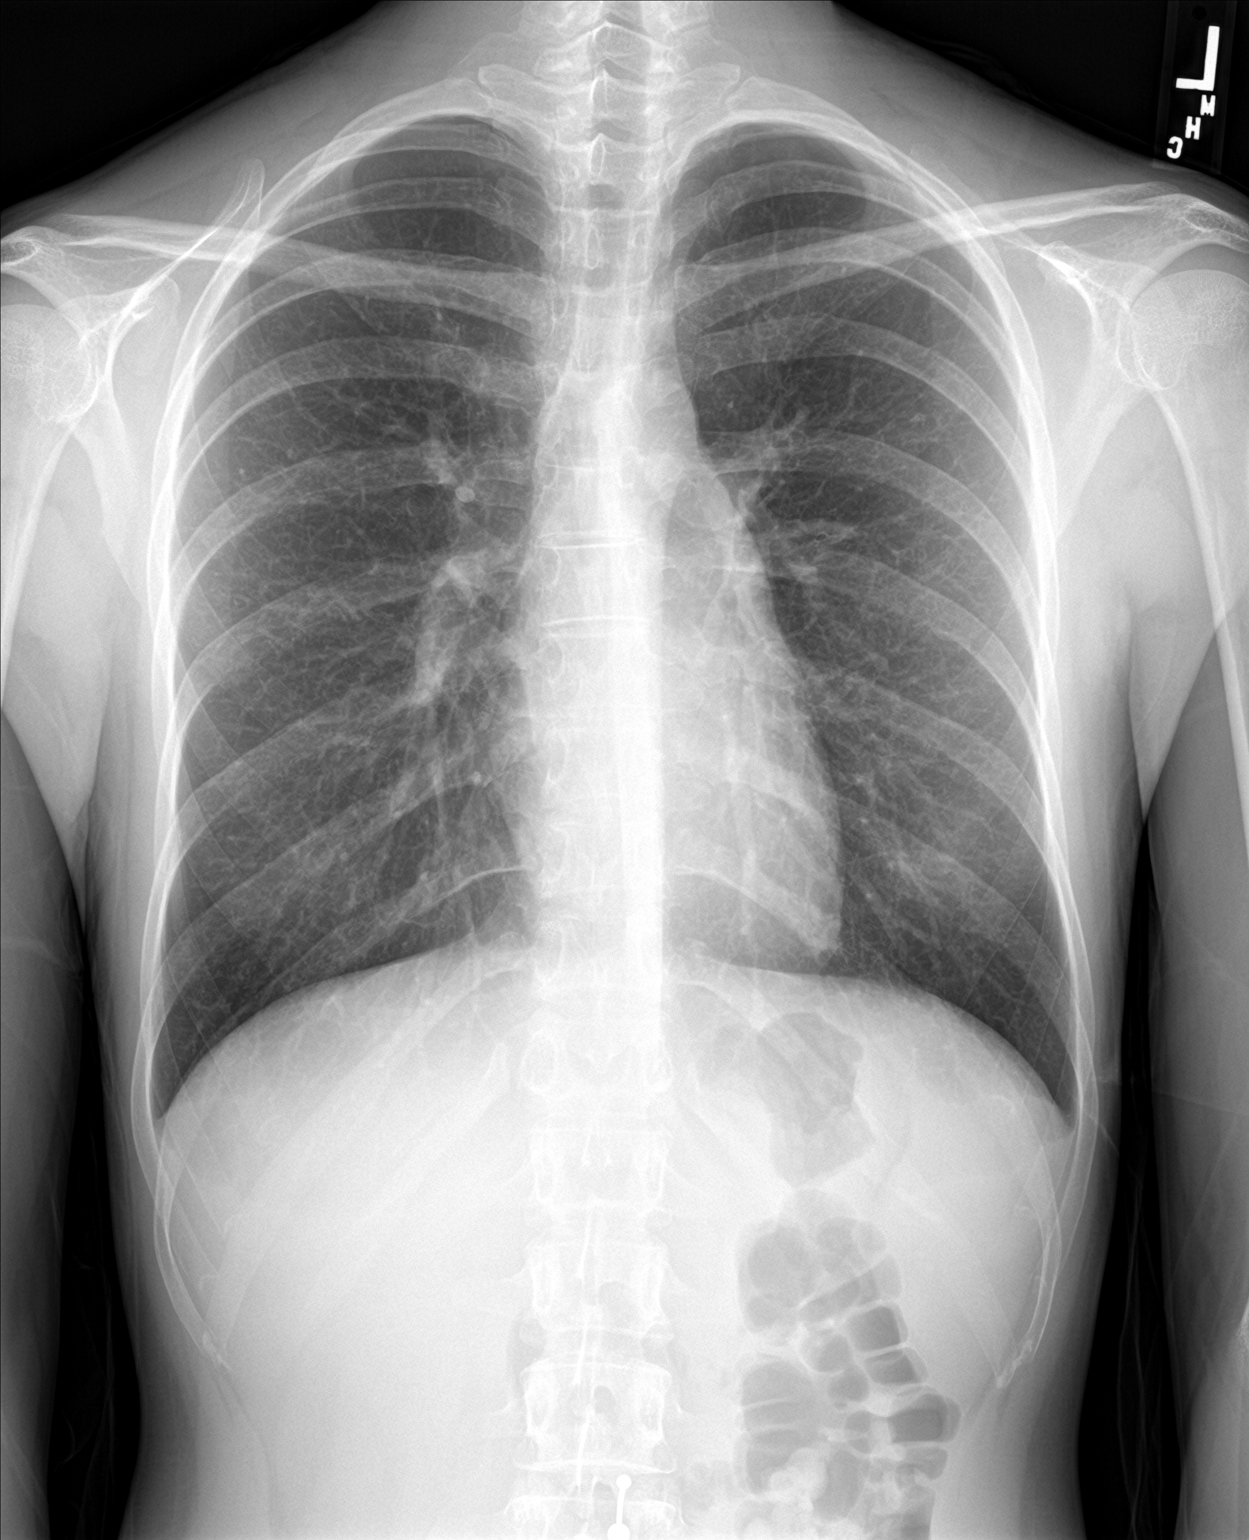

[chest lat]
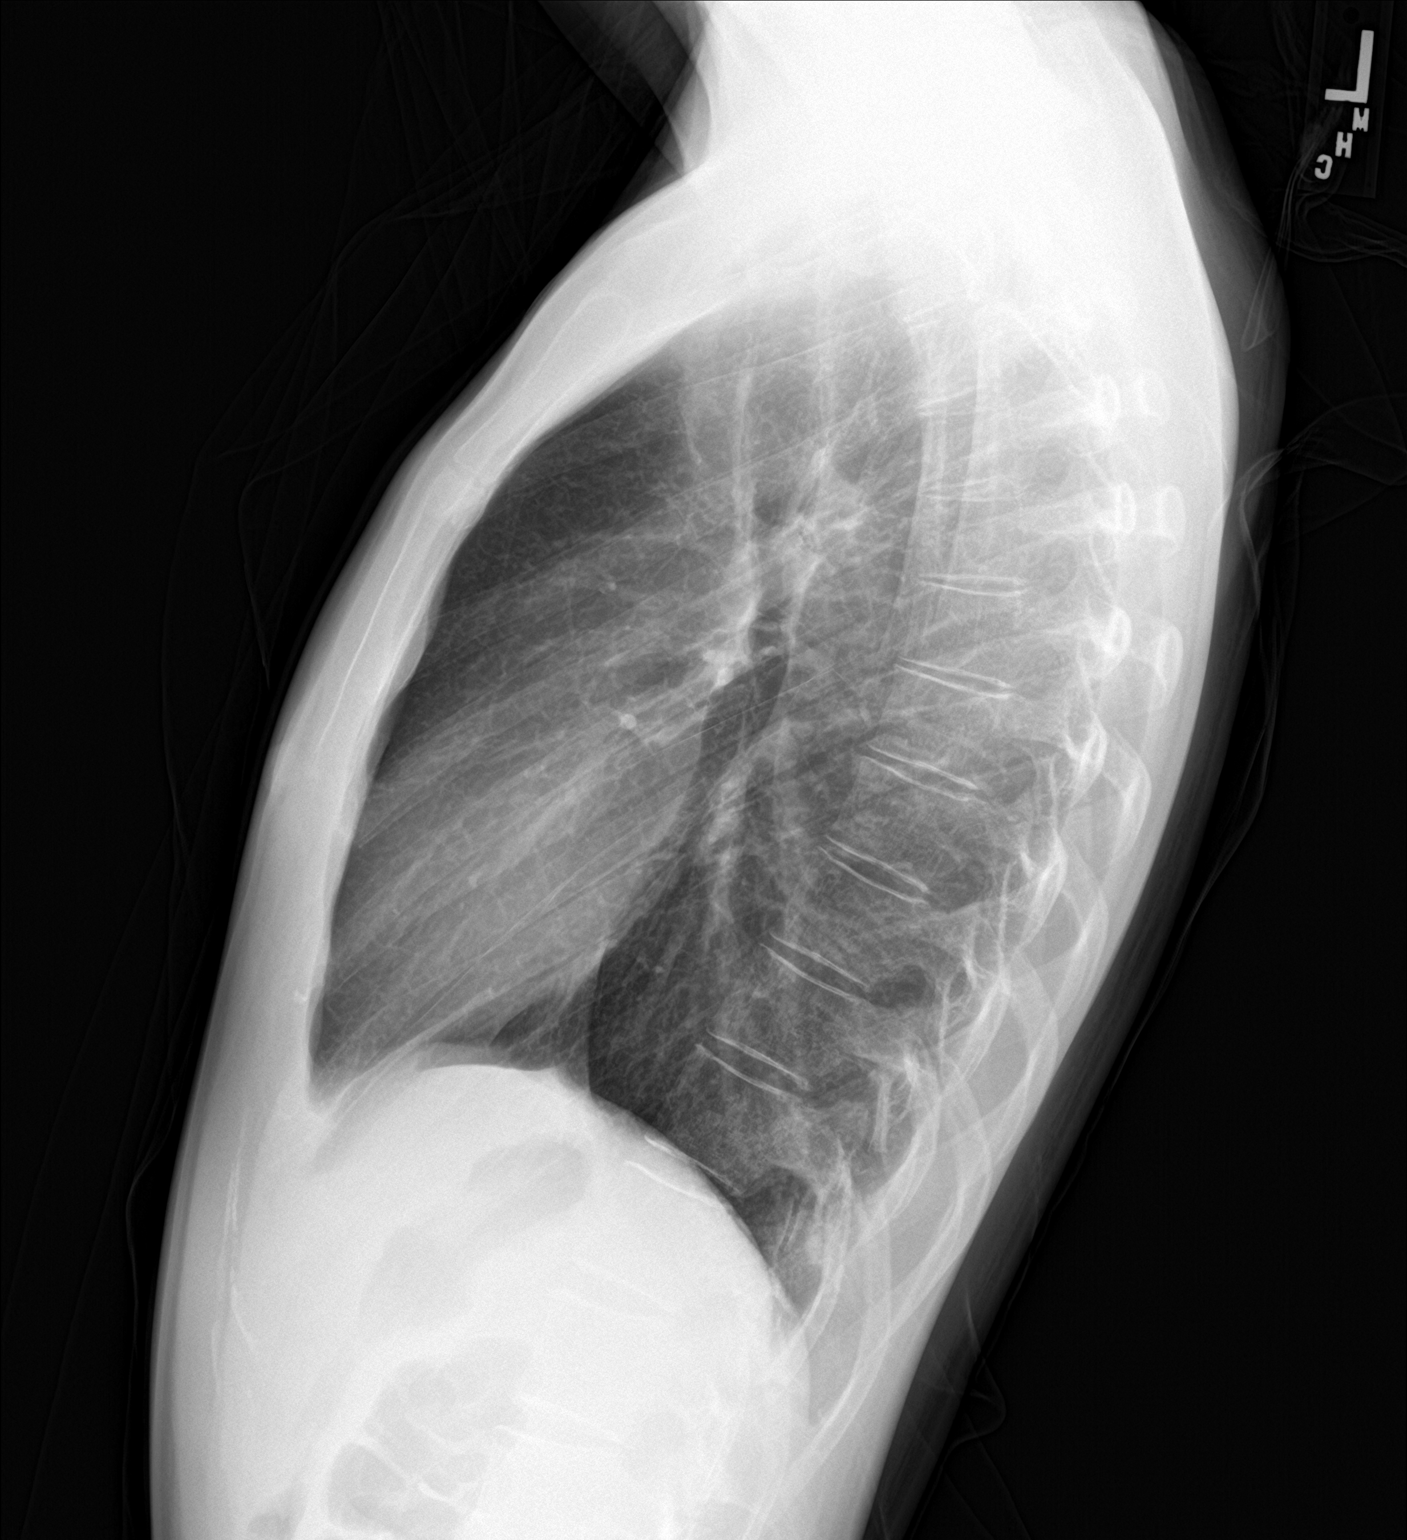

[2 of 2 positions shown; findings below may reference images not displayed]

FINDINGS: The heart size and mediastinal contours are within normal limits.
Both lungs are clear. The visualized skeletal structures are
unremarkable.
IMPRESSION: No active cardiopulmonary disease.
